# Patient Record
Sex: Female | Born: 1940 | Race: White | Hispanic: No
Health system: Midwestern US, Academic
[De-identification: ages and names within clinical notes are randomized; demographics above are authoritative.]

---

## 2016-06-27 MED ORDER — METOPROLOL TARTRATE 25 MG PO TAB
12.5 mg | ORAL_TABLET | Freq: Two times a day (BID) | ORAL | 3 refills | 90.00000 days | Status: DC
Start: 2016-06-27 — End: 2017-05-30

## 2017-05-30 ENCOUNTER — Ambulatory Visit: Admit: 2017-05-30 | Discharge: 2017-05-30 | Payer: MEDICARE

## 2017-05-30 DIAGNOSIS — R002 Palpitations: Principal | ICD-10-CM

## 2017-05-30 DIAGNOSIS — F4329 Adjustment disorder with other symptoms: ICD-10-CM

## 2017-05-30 DIAGNOSIS — I700 Atherosclerosis of aorta: ICD-10-CM

## 2017-05-30 LAB — LIPID PROFILE
Lab: 128 mg/dL — ABNORMAL HIGH (ref <100)
Lab: 147 mg/dL (ref <150)
Lab: 154 mg/dL
Lab: 222 mg/dL — ABNORMAL HIGH (ref <200)
Lab: 29 mg/dL
Lab: 68 mg/dL (ref >40)

## 2017-06-03 ENCOUNTER — Encounter: Admit: 2017-06-03 | Discharge: 2017-06-03 | Payer: MEDICARE

## 2017-06-03 DIAGNOSIS — M797 Fibromyalgia: ICD-10-CM

## 2017-06-03 DIAGNOSIS — R002 Palpitations: Principal | ICD-10-CM

## 2018-08-06 ENCOUNTER — Encounter: Admit: 2018-08-06 | Discharge: 2018-08-06

## 2018-08-13 ENCOUNTER — Encounter: Admit: 2018-08-13 | Discharge: 2018-08-13

## 2018-08-13 ENCOUNTER — Ambulatory Visit: Admit: 2018-08-13 | Discharge: 2018-08-14

## 2018-08-13 DIAGNOSIS — Z9889 Other specified postprocedural states: Secondary | ICD-10-CM

## 2018-08-13 DIAGNOSIS — R002 Palpitations: Secondary | ICD-10-CM

## 2018-08-13 DIAGNOSIS — I700 Atherosclerosis of aorta: Secondary | ICD-10-CM

## 2018-08-13 DIAGNOSIS — M797 Fibromyalgia: Secondary | ICD-10-CM

## 2018-08-13 NOTE — Patient Instructions
Your palpitations were clearly not cardiac in origin.  Sometimes will want a pursue evaluation for problem that seems to have gone away but in your case it is clear that the esophagus was the cause of the problem and that is now been corrected.    We did not have a hard decision about tell you did not start on a statin like Lipitor for your risk over 10 years is 12.3% without it in the computer projects risk reduction down to 12.0% with 10 years of taking pills.  It is very clear that taking 3000 doses of statin over 10 years to get trivial change in risk is a waste of time.    I do not think you have enough going on to warrant me telling you that you need to schedule appointment and a specific date in the future.  Let us leave the follow-up on an "as needed" basis.    Do not forget to be physically active 3 hours a week to make sure that you do not become deconditioned.    It's good to see you today.   Call in if you have problems or questions.   Lenoard Aden, MD

## 2020-07-07 ENCOUNTER — Encounter: Admit: 2020-07-07 | Discharge: 2020-07-07 | Payer: MEDICARE

## 2020-07-07 ENCOUNTER — Ambulatory Visit: Admit: 2020-07-07 | Discharge: 2020-07-07 | Payer: MEDICARE

## 2020-07-07 DIAGNOSIS — R55 Syncope and collapse: Secondary | ICD-10-CM

## 2020-07-07 DIAGNOSIS — M797 Fibromyalgia: Secondary | ICD-10-CM

## 2020-07-07 DIAGNOSIS — R002 Palpitations: Secondary | ICD-10-CM

## 2020-07-07 DIAGNOSIS — I208 Other forms of angina pectoris: Secondary | ICD-10-CM

## 2020-07-07 DIAGNOSIS — I471 Supraventricular tachycardia: Secondary | ICD-10-CM

## 2020-07-07 NOTE — Patient Instructions
Everything seems fine.  The findings on your heart rhythm monitor are all normal.  You had 1 4 beat run of a slightly faster heart rate than normal.  There is no reason to stay on medications for this.  This had nothing to do with your blackout.  I think your blackout was a combination of perhaps dehydration and some reflexes that led your blood vessels to dilate and perhaps have your heart slowed down too much.  Its been 4 months and you have not had recurrence.  Cannot guarantee this will never happen again but it is unlikely.  Be sure to stay hydrated through the summer and if you feel lightheaded be prepared to sit down promptly and perhaps lower your head to between your knees.  You are doing fairly well now and I do not think you are going to get into this situation again but its possible.    You do not need any further cardiac testing either because of the blackout or for any other reason right now.    Be sure to stay active 3 hours a week with walking.  You might wish you weight what she did when you graduated from high school but your weight right now gives you a body mass index just barely over 25.  This technically is overweight but I think it is a healthy weight for you and is 20 pounds less than in 2017.    No know when you need to get back up to see me again.  I do not think there is any particular reason to see me in a year but you can schedule an appointment with me at any time.  My nurse or I will be in touch when we see the cholesterol profile.  I doubt that your cholesterol will be high enough to warrant treatment with a statin pill.    It's good to see you today.   My Chart is the best way to communicate with [place] but if you prefer, call in if you have problems or questions.   Marissa Nestle, MD

## 2020-07-07 NOTE — Progress Notes
Date of Service: 07/07/2020    Robin Braun is a 80 y.o. female.       HPI     Robin Braun presents for interval cardiology evaluation with the main focus being on a episode of syncope that occurred 03/26/20 while she was spending the winter in [place] she was evaluated by her cardiologist in [place]..  She has those records with her.  Holter monitor after her syncope episode was basically unremarkable although a 4 beat run of SVT at a rate of 142 prompted treatment with Toprol-XL 50 which was then reduced to 25 due to fatigue.  She had no atrial fib or flutter no prolonged pauses.  She continues taking this and reports that even the low-dose Toprol-XL makes her feel fatigued and unable to get in to her normal activities of daily living pattern without feeling too tired.  She is not having any shortness of breath or wheezing she just feels tired.    I last saw her a couple of years ago for general health evaluation as I take care of her late husband Robin Braun for many years before he passed away apparently because of a inoperable AV malformation in his brain.  She is never had any major cardiovascular disease problems.  She has had her left knee replaced without cardiovascular events.  I evaluated her for palpitations in the past in 2007 and again in 2013 with no significant findings.    She is a lifetime non-smoker with less than a full bottle of any alcoholic beverage in her life total.  Her cholesterol when last checked was minimally out of line with LDL 128 that did not warrant statin treatment.  She is fasting today so we cannot recheck.  She is not discussing cholesterol follow-up with Dr. Herschell Dimes her primary care physician when she is in [place].   05/30/2017 11:18   Cholesterol 222 (H)   Triglycerides 147   HDL 68   LDL 128 (H)     She will haver her right knee replace by Dr. Jearl Klinefelter sometime late summer at [facility].        Vitals:    07/07/20 1108   BP: 112/62   BP Source: Arm, Left Upper Pulse: 55   PainSc: Zero   Weight: 69 kg (152 lb 1.6 oz)   Height: 162.6 cm (5' 4)     Body mass index is 26.11 kg/m?[name]     Past Medical History  Patient Active Problem List    Diagnosis Date Noted   ?  esophageal hernia repairs, 2012, 2020 08/13/2018     2012 Initial Mesh repair [name], [name]  July 1,202 Laparoscopic Revision of repair [name], [name]     ? 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 She has a IgG level of 158, overseen by [name],      ? Bronchiectasis without complication (HCC) 02/10/2014     bronchiectasis  Noted by [name].      ? Hepatic steatosis 02/10/2014     2015: CT abdomen: mild fatty infiltration of the liver, mild intrahepatic ductal dilatation.  cholecystectomy. There was mild extrahepatic ductal dilatation, common bile duct was 5.6 mm and on prior CT of 4.4 mm. Suggestion of some mild wall thickening of the  distal common duct. There were no hepatic filling defects seen and no adenopathy was seen.Impressions:   fatty infiltration of the liver, post cholecystectomy with ectasia of the bile duct, and mild thickening of the wall of the common duct. In the presence of mildly elevated liver enzymes without elevated bilirubin a biopsy should be considered. No focal solid organ abnormality, a small hiatal hernia, mild left colon diverticulosis, post hysterectomy changes of the bladder.       ? Palpitation 12/14/2011     2007 palpitations, resolved spontanously  11/13 recurrent palpitations, Eval [facility] [name]:K+=4.7, EchoDop: EF 60%, normal LA, No TR, PAP not measurable, Valves OK no effusion.  32 day  event monitor: 5episodes of pounding heartbeats and fluttering palpitations associated w/ RBBB single  PVCs.  06/27/13 8 1/2 minute Bruce Exercise Echo EF 55%, no ischemia. Valves OK.  HR>100% target, rare PVC, stopped w/ fatigue, no ST changes or arrhythmias.            Review of Systems   Constitutional: Positive for malaise/fatigue.   HENT: Negative.    Eyes: Negative.    Cardiovascular: Negative.    Respiratory: Negative.    Endocrine: Negative.    Hematologic/Lymphatic: Negative.    Skin: Negative.    Musculoskeletal: Negative.    Gastrointestinal: Negative.    Genitourinary: Negative.    Neurological: Negative.    Psychiatric/Behavioral: Negative.    Allergic/Immunologic: Negative.    All other systems reviewed and are negative.  14 organ system review noted. It is negative except as reported in current narrative or above in the ROS section. This is a patient centered review of systems that was stated by the patient in her terms prior to my personal problem oriented interview with the patient     Physical Exam  General: Patient in no distress, looks generally healthy. Skin warm and dry, non icteric,  Wearing medical face mask   Mucous membranes moist.  Pupils equal and round  Carotids: no bruits    Thyroid not enlarged.  Neck veins: CVP <6 normal, no V wave, no HJR     Respiratory: Breathing comfortably. Lungs clear to percussion & auscultation. No rales, rhonchi or wheezing   Cardiac: Regular rhythm. LV impulse not palpable. Normal S1 & S2, Fourth heart sound, no rub or S3.  Gr I/vi left parasternal systolic murmur very localized. No diastolic murmur   Abdomen: soft, non-tender, no masses,bruits,hepatic or aortic enlargement. + bowel sounds.   Femoral arteries: Good pulses, no bruits.  Legs/feet: Normal PT pulses, no edema.   Motor: Normal muscle strength. Cognitive: Pleasant demeanor. Good insight. 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   08/13/18 27.62 kg/m?   05/30/17 27.81 kg/m?      Smoking Social History     Tobacco Use   Smoking Status Never Smoker   Smokeless Tobacco Never Used      Lipid Profile Cholesterol   Date Value Ref Range Status  05/30/2017 222 (H) <200 MG/DL Final     HDL   Date Value Ref Range Status   05/30/2017 68 >40 MG/DL Final     LDL   Date Value Ref Range Status   05/30/2017 128 (H) <100 MG/DL Final     Triglycerides   Date Value Ref Range Status   05/30/2017 147 <150 MG/DL Final      Blood Sugar No results found for: HGBA1C  Glucose   Date Value Ref Range Status   01/09/2018 144 (H) 83 - 110 Final   01/03/2016 105  Final   02/10/2014 99  Final          Problems Addressed Today  No diagnosis found.    Assessment and Plan     Just blood pressure readings are all all fine with the vast majority below 130/80.  Her exam is benign.  She has minimal systolic murmur that is unchanged from when I saw her previously and noted it to be marginally worth reporting.  Her echo done in February after her syncopal episode in [place] is totally benign with no evidence of even aortic sclerosis worth mentioning.  Her Holter monitor was basically normal.  The 4 beat run of SVT is clinically insignificant.  She was started on 50 mg Toprol daily for that and cut back to 25 when she felt fatigue.  She did not does not feel as good taking 25 that she did drug-free.  There is no indication for her to stay on the Toprol so I am telling her she can stop the 25 mg dose completely without further taper effective today.    That it would be a good for her to check her lipid profile.  She can do this in [place].  We will put the order in.  The results can be routed back to me or through [facility].  I will give her an opinion about need for statin but I do not think it that she will have a strong indication.    She will have her right knee replaced by Dr[facility]Wilkerson at [facility] in a few months once the scans and scheduling has been completed.  There is no reason for her to have any further cardiac testing or to see me or again for pro preop clearance.  She has perfectly fine cardiac system and is is is is good the condition is in a 80 year old woman could be before knee replacement.    I am not sure that she needs annual follow-up with me but she made her way from [place] to the [place] office today to see me and can do so again when the need arises.    30 minutes were expended as the total time for this encounter.  The time was spent reviewing records,  interviewing patient, doing exam, developing diagnosis, creating treatment plan written in patient oriented  terminology  for the AVS, explaining it to the patient and entering further information in the EMR.      NB: The free text in this document was generated through Dragon(TM) software with editing and proofreading  done by the author of this document Dr. Mable Paris MD, [facility] principally at the point of care. Some errors may persist.  If there are questions about content in this document please contact Dr. Hale Bogus.    The written information I provided Ms. Licklider at the conclusion of today's encounter is as  follows:    Patient Instructions   Everything  seems fine.  The findings on your heart rhythm monitor are all normal.  You had 1 4 beat run of a slightly faster heart rate than normal.  There is no reason to stay on medications for this.  This had nothing to do with your blackout.  I think your blackout was a combination of perhaps dehydration and some reflexes that led your blood vessels to dilate and perhaps have your heart slowed down too much.  Its been 4 months and you have not had recurrence.  Cannot guarantee this will never happen again but it is unlikely.  Be sure to stay hydrated through the summer and if you feel lightheaded be prepared to sit down promptly and perhaps lower your head to between your knees.  You are doing fairly well now and I do not think you are going to get into this situation again but its possible.    You do not need any further cardiac testing either because of the blackout or for any other reason right now.    Be sure to stay active 3 hours a week with walking.  You might wish you weight what she did when you graduated from high school but your weight right now gives you a body mass index just barely over 25.  This technically is overweight but I think it is a healthy weight for you and is 20 pounds less than in 2017.    No know when you need to get back up to see me again.  I do not think there is any particular reason to see me in a year but you can schedule an appointment with me at any time.  My nurse or I will be in touch when we see the cholesterol profile.  I doubt that your cholesterol will be high enough to warrant treatment with a statin pill.    It's good to see you today.   My Chart is the best way to communicate with [place] but if you prefer, call in if you have problems or questions.   Marissa Nestle, MD              Current Medications (including today's revisions)  ? metoprolol XL (TOPROL XL) 25 mg extended release tablet Take 25 mg by mouth daily.   ? progesterone, micronized (PROMETRIUM) 200 mg capsule Take 200 mg by mouth daily.

## 2020-08-23 ENCOUNTER — Encounter: Admit: 2020-08-23 | Discharge: 2020-08-23 | Payer: MEDICARE

## 2020-08-23 ENCOUNTER — Ambulatory Visit: Admit: 2020-08-23 | Discharge: 2020-08-23 | Payer: MEDICARE

## 2020-08-23 DIAGNOSIS — I493 Ventricular premature depolarization: Secondary | ICD-10-CM

## 2020-08-23 DIAGNOSIS — R002 Palpitations: Secondary | ICD-10-CM

## 2020-08-23 DIAGNOSIS — M797 Fibromyalgia: Secondary | ICD-10-CM

## 2020-08-23 MED ORDER — METOPROLOL SUCCINATE 25 MG PO TB24
12.5 mg | ORAL_TABLET | Freq: Every evening | ORAL | 1 refills | 90.00000 days | Status: AC
Start: 2020-08-23 — End: ?

## 2020-08-23 NOTE — Patient Instructions
Start taking Metoprolol 12.5 mg (1/2 tablet) at bedtime.     Increase water intake and limit caffeine and tea drinks.     We will call you in 2 weeks to see how you are feeling.

## 2020-08-23 NOTE — Progress Notes
Date of Service: 08/23/2020    Robin Braun is a 80 y.o. female.       HPI     Robin Braun presents to the office today for cardiovascular follow up.  She is a 80 y.o. female with a past medical history of palpitations and aortic valve sclerosis.  She follows with Dr. Hale Bogus and was last seen on July 07, 2020.  While she was vacationing in [place] last winter she had an episode of syncope in her cardiologist placed a Holter monitor that revealed brief runs of SVT and she was initiated on metoprolol XL 50 mg daily that was reduced to 25 mg daily due to fatigue.  At her last office visit in June this was stopped due to similar complaints.    She recently called her office with increasing symptomatic palpitations and presents today with her husband for evaluation.  She states since her metoprolol was discontinued last month, she has had recurrence of her palpitations with associated complaints of chest tightness and lightheadedness.  She denies chest pain, shortness of breath, peripheral edema, near-syncope or syncope.  She states her worst episode was while they were traveling in their RV and she had to rest for 2 hours before she could return to any physical activity.        Vitals:    08/23/20 1251   BP: 112/62   BP Source: Arm, Left Upper   Pulse: 93   SpO2: 96%   PainSc: Zero   Weight: 67.9 kg (149 lb 12.8 oz)   Height: 165.1 cm (5' 5)     Body mass index is 24.93 kg/m?Robin Braun     Past Medical History  Patient Active Problem List    Diagnosis Date Noted   ? 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  esophageal hernia repairs, 2012, 2020 08/13/2018     2012 Initial Mesh repair Robin Braun, Robin Braun  July 1,202 Laparoscopic Revision of repair Robin Braun, Dr Maricela [facility]     ? 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  EF 60 to 65% no valve disease.  Septum 1.67 cm posterior wall 1.44 cm     ? 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 She has a IgG level of 158, overseen by Robin Braun,      ? Bronchiectasis without complication (HCC) 02/10/2014     bronchiectasis  Noted by Robin Braun.      ? Hepatic steatosis 02/10/2014     2015: CT abdomen: mild fatty infiltration of the liver, mild intrahepatic ductal dilatation.  cholecystectomy. There was mild extrahepatic ductal dilatation, common bile duct was 5.6 mm and on prior CT of 4.4 mm. Suggestion of some mild wall thickening of the distal common duct. There were no hepatic filling defects seen and no adenopathy was seen.Impressions:   fatty infiltration of the liver, post cholecystectomy with ectasia of the bile duct, and mild thickening of the wall of the common duct. In the presence of mildly elevated liver enzymes without elevated bilirubin a biopsy should be considered. No focal solid organ abnormality, a small hiatal hernia, mild left colon diverticulosis, post hysterectomy changes of the bladder.       ? Palpitation 12/14/2011     2007 palpitations, resolved spontanously  11/13 recurrent  palpitations, Eval [facility] [name]:K+=4.7, EchoDop: EF 60%, normal LA, No TR, PAP not measurable, Valves OK no effusion.  32 day  event monitor: 5episodes of pounding heartbeats and fluttering palpitations associated w/ RBBB single  PVCs.  06/27/13 8 1/2 minute Bruce Exercise Echo EF 55%, no ischemia. Valves OK.  HR>100% target, rare PVC, stopped w/ fatigue, no ST changes or arrhythmias.            Review of Systems   Constitutional: Positive for malaise/fatigue.   HENT: Negative.    Eyes: Negative.    Cardiovascular: Positive for irregular heartbeat and palpitations.   Respiratory: Negative.    Endocrine: Negative.    Hematologic/Lymphatic: Negative.    Skin: Negative.    Musculoskeletal: Negative.    Gastrointestinal: Negative.    Genitourinary: Negative.    Neurological: Negative. Psychiatric/Behavioral: Negative.    Allergic/Immunologic: Negative.    All other systems reviewed and are negative.      Physical Exam  General Appearance: no acute distress  Skin: warm & intact  HEENT: unremarkable  Neck Veins: neck veins are flat & not distended  Carotid Arteries: no bruits  Chest Inspection: chest is normal in appearance  Auscultation/Percussion: lungs clear to auscultation, no rales, rhonchi, or wheezing  Cardiac Rhythm: regular rhythm & normal rate  Cardiac Auscultation: Normal S1 & S2, no S3 or S4, no rub  Murmurs: no cardiac murmurs   Extremities: no lower extremity edema; 2+ symmetric distal pulses  Abdominal Exam: soft, non-tender, no masses, bowel sounds normal  Neurologic Exam: oriented to time, place and person; no focal neurologic deficits  Psychiatric: Normal mood and affect.  Behavior is normal. Judgment and thought content normal.         Cardiovascular Studies      Cardiovascular Health Factors  Vitals BP Readings from Last 3 Encounters:   08/23/20 112/62   07/07/20 112/62   08/13/18 112/64     Wt Readings from Last 3 Encounters:   08/23/20 67.9 kg (149 lb 12.8 oz)   07/07/20 69 kg (152 lb 1.6 oz)   08/13/18 75.3 kg (166 lb)     BMI Readings from Last 3 Encounters:   08/23/20 24.93 kg/m?   07/07/20 26.11 kg/m?   08/13/18 27.62 kg/m?      Smoking Social History     Tobacco Use   Smoking Status Never Smoker   Smokeless Tobacco Never Used      Lipid Profile Cholesterol   Date Value Ref Range Status   05/30/2017 222 (H) <200 MG/DL Final     HDL   Date Value Ref Range Status   05/30/2017 68 >40 MG/DL Final     LDL   Date Value Ref Range Status   05/30/2017 128 (H) <100 MG/DL Final     Triglycerides   Date Value Ref Range Status   05/30/2017 147 <150 MG/DL Final      Blood Sugar No results found for: HGBA1C  Glucose   Date Value Ref Range Status   01/09/2018 144 (H) 83 - 110 Final   01/03/2016 105  Final   02/10/2014 99  Final          Problems Addressed Today  Encounter Diagnoses Name Primary?   ? Palpitation Yes   ? Symptomatic PVCs        Assessment and Plan     1.  Palpitations with symptomatic PVCs.  She wore a Holter monitor earlier this year at the direction of her cardiologist in  [place] that did reveal brief runs of SVT and PVCs.  She was initiated on metoprolol XL 50 mg daily that was then reduced 25 mg daily due to fatigue.  This was ultimately stopped on June 8 by Dr. Hale Bogus.  Since that time she has had an increase in her palpitations and she has been quite symptomatic recently.  We discussed resuming metoprolol XL 12.5 mg and to take at bedtime.  She will continue to track her blood pressure and heart rate log.  We will call her in 2 weeks to see if her symptoms have improved.    Judieth Keens, APRN-NP           Current Medications (including today's revisions)  ? cholecalciferol (vitamin D3) (VITAMIN D3) 1,000 units tablet Take 1,000 Units by mouth daily.   ? metoprolol XL (TOPROL XL) 25 mg extended release tablet Take one-half tablet by mouth at bedtime daily.   ? progesterone, micronized (PROMETRIUM) 200 mg capsule Take 200 mg by mouth daily.   ? zinc sulfate 220 mg (50 mg elemental zinc) capsule Take 220 mg by mouth daily.

## 2020-10-18 ENCOUNTER — Encounter: Admit: 2020-10-18 | Discharge: 2020-10-18 | Payer: MEDICARE

## 2020-10-18 ENCOUNTER — Ambulatory Visit: Admit: 2020-10-18 | Discharge: 2020-10-18 | Payer: MEDICARE

## 2020-10-18 DIAGNOSIS — Z9889 Other specified postprocedural states: Secondary | ICD-10-CM

## 2020-10-18 DIAGNOSIS — I209 Angina pectoris, unspecified: Secondary | ICD-10-CM

## 2020-10-18 DIAGNOSIS — Z136 Encounter for screening for cardiovascular disorders: Secondary | ICD-10-CM

## 2020-10-18 DIAGNOSIS — R002 Palpitations: Secondary | ICD-10-CM

## 2020-10-18 DIAGNOSIS — M797 Fibromyalgia: Secondary | ICD-10-CM

## 2020-10-18 LAB — COMPREHENSIVE METABOLIC PANEL
ALBUMIN: 4 g/dL (ref 3.5–5.0)
ALK PHOSPHATASE: 105 U/L (ref 25–110)
ALT: 15 U/L (ref 7–56)
ANION GAP: 9 (ref 3–12)
AST: 22 U/L (ref 7–40)
BLD UREA NITROGEN: 12 mg/dL (ref 7–25)
CALCIUM: 9.5 mg/dL (ref 8.5–10.6)
CHLORIDE: 104 MMOL/L (ref 98–110)
CO2: 28 MMOL/L (ref 21–30)
CREATININE: 0.7 mg/dL (ref 0.4–1.00)
EGFR: >60 mL/min (ref >60)
GLUCOSE,PANEL: 99 mg/dL (ref 70–100)
POTASSIUM: 4.6 MMOL/L (ref 3.5–5.1)
SODIUM: 141 MMOL/L (ref 137–147)
TOTAL BILIRUBIN: 0.3 mg/dL (ref 0.3–1.2)
TOTAL PROTEIN: 6.4 g/dL (ref 6.0–8.0)

## 2020-10-18 LAB — CBC AND DIFF
ABSOLUTE BASO COUNT: 0 K/UL (ref 0–0.20)
ABSOLUTE EOS COUNT: 0.4 K/UL (ref 0–0.45)
ABSOLUTE LYMPH COUNT: 1.2 K/UL (ref 1.0–4.8)
ABSOLUTE MONO COUNT: 0.7 K/UL (ref 0–0.80)
ABSOLUTE NEUTROPHIL: 5.5 K/UL (ref 1.8–7.0)
BASOPHILS %: 1 % (ref 0–2)
EOSINOPHILS %: 6 % — ABNORMAL HIGH (ref 0–5)
HEMATOCRIT: 34 % — ABNORMAL LOW (ref 36–45)
HEMOGLOBIN: 11 g/dL — ABNORMAL LOW (ref 12.0–15.0)
LYMPHOCYTES %: 15 % — ABNORMAL LOW (ref 24–44)
MCH: 29 pg (ref 26–34)
MCHC: 33 g/dL (ref 32.0–36.0)
MCV: 87 FL (ref 80–100)
MONOCYTES %: 9 % (ref 4–12)
MPV: 9.4 FL (ref 7–11)
NEUTROPHILS %: 69 % (ref 41–77)
PLATELET COUNT: 261 K/UL (ref 150–400)
RBC COUNT: 3.9 M/UL — ABNORMAL LOW (ref 4.0–5.0)
RDW: 13 % (ref 11–15)
WBC COUNT: 8 K/UL (ref 4.5–11.0)

## 2020-10-18 LAB — BNP (B-TYPE NATRIURETIC PEPTI): BNP: 54 pg/mL (ref 0–100)

## 2020-10-18 MED ORDER — NITROGLYCERIN 0.3 MG SL SUBL
0.3 mg | ORAL_TABLET | SUBLINGUAL | 1 refills | 9.00000 days | Status: AC | PRN
Start: 2020-10-18 — End: ?

## 2020-10-18 NOTE — Progress Notes
Date of Service: 10/18/2020    Robin Braun is a 80 y.o. female.       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  She is a 80 year old generally active resident of [place] who new for years as a wife of another patient of mine who has since died from a ruptured brain aneurysm.  Today she brought in her blood pressure log that shows generally satisfactory blood pressures but 1 reading in the 160 range that caught my attention.  She states this happens when she has trouble with GI distress after eating.  She gets palpitations her esophagus spasm really bothers her and her blood pressure readings are high.  She has had 2 esophageal hernia repairs the first in 2012 and the second in 2020.  Her pain and symptoms improved with the second surgery but she still has them episodically.  When she is at the worst part of the pain she will feel her heart pounding rapidly.    She had a couple of episodes 1 in church yesterday when she stood up she felt a wave of rapid heartbeats and felt a little unsteady but it passed.  She had a similar episode on Friday.    Although this was not her primary concern today as I asked her to give a more complete review of her current functional state she told me that she has recently had episodes with physical activity such as walking up the stairs in her basement when she gets severe retrosternal chest pain that she describes by saying it just hurts so bad.  She rests a while and it resolves.  These do not occur with every physical activity that she does but she does relate them more often than at other times to activities such as walking up the stairs from her basement.  Additionally she had 3 of these episodes awaken a week ago.  She states that this is  the same type of discomfort that she describes when she was walking up the stairs    Additionally she   has episodic feelings where is like she has a rope wrapped around her chest and she just cannot breathe.  She talked to [name] about this who sees her for bronchiectasis.  He told her this is not a pulmonary problem.    Other than postprandial pain which is different from what she is describing above related to symptoms she had hoped would resolve after her second esophageal hernia repair she has no major issues.  Vitals:    10/18/20 1534   BP: 118/64   BP Source: Arm, Left Upper   Pulse: 58   SpO2: 94%   PainSc: Zero   Weight: 68.3 kg (150 lb 9.6 oz)   Height: 165.1 cm (5' 5)     Body mass index is 25.06 kg/m?[name]     Past Medical History  Patient Active Problem List    Diagnosis Date Noted   ? 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 Esophageal hernia repairs, 2012, 2020 08/13/2018     2012 Initial Mesh repair [name], [name]  July 1,2020 Laparoscopic Revision of repair [name], Dr [name] surgeons. [facility]     ? 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  EF 60 to 65% no valve disease.  Septum 1.67 cm posterior wall 1.44 cm     ? 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 She has a IgG level of 158, overseen by [name],      ? Bronchiectasis without complication (HCC) 02/10/2014     bronchiectasis  Noted by [name].      ? Hepatic steatosis 02/10/2014     2015: CT abdomen: mild fatty infiltration of the liver, mild intrahepatic ductal dilatation.  cholecystectomy. There was mild extrahepatic ductal dilatation, common bile duct was 5.6 mm and on prior CT of 4.4 mm. Suggestion of some mild wall thickening of the distal common duct. There were no hepatic filling defects seen and no adenopathy was seen.Impressions:   fatty infiltration of the liver, post cholecystectomy with ectasia of the bile duct, and mild thickening of the wall of the common duct. In the presence of mildly elevated liver enzymes without elevated bilirubin a biopsy should be considered. No focal solid organ abnormality, a small hiatal hernia, mild left colon diverticulosis, post hysterectomy changes of the bladder.       ? Palpitation 12/14/2011     2007 palpitations, resolved spontanously  11/13 recurrent palpitations, Eval [facility] [name]:K+=4.7, EchoDop: EF 60%, normal LA, No TR, PAP not measurable, Valves OK no effusion.  32 day  event monitor: 5episodes of pounding heartbeats and fluttering palpitations associated w/ RBBB single  PVCs.  06/27/13 8 1/2 minute Bruce Exercise Echo EF 55%, no ischemia. Valves OK.  HR>100% target, rare PVC, stopped w/ fatigue, no ST changes or arrhythmias.            Review of Systems   Constitutional: Positive for malaise/fatigue.   HENT: Negative.    Eyes: Negative.    Cardiovascular: Negative.    Respiratory: Negative.    Endocrine: Negative.    Hematologic/Lymphatic: Negative.    Skin: Negative.    Musculoskeletal: Negative.    Gastrointestinal: Negative.    Genitourinary: Negative.    Neurological: Negative.    Psychiatric/Behavioral: Negative.    Allergic/Immunologic: Negative.    14 organ system review noted. It is negative except as reported in current narrative or above in the ROS section. This is a patient centered review of systems that was stated by the patient in her terms prior to my personal problem oriented interview with the patient     Physical Exam  General: Patient in no distress, looks generally healthy. Skin warm and dry, non icteric,  Wearing medical face mask   Mucous membranes moist.  Pupils equal and round  Carotids: no bruits    Thyroid not enlarged.  Neck veins: CVP <6 normal, no V wave, no HJR     Respiratory: Breathing comfortably. Lungs clear to percussion & auscultation. No rales, rhonchi or wheezing   Cardiac: Regular rhythm. LV impulse not palpable. Normal S1 & S2, Fourth heart sound, no rub or S3.  Gr I/vi left parasternal systolic murmur. No diastolic murmur   Abdomen: soft, non-tender, no masses,bruits,hepatic or aortic enlargement. + bowel sounds.   Femoral arteries: Good pulses, no bruits.  Legs/feet: Normal PT pulses, no edema.   Motor: Normal muscle strength. Cognitive: Pleasant demeanor. Good insight. No depression     Cardiovascular Studies  Today's 12 lead EKG: sinus bradycardia, rate 58. 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   08/23/20 24.93 kg/m?   07/07/20 26.11 kg/m?      Smoking Social History     Tobacco Use   Smoking Status Never Smoker   Smokeless Tobacco Never Used      Lipid Profile Cholesterol   Date Value Ref Range Status   05/30/2017 222 (H) <200 MG/DL Final     HDL   Date Value Ref Range Status   05/30/2017 68 >40 MG/DL Final     LDL   Date Value Ref Range Status   05/30/2017 128 (H) <100 MG/DL Final     Triglycerides   Date Value Ref Range Status   05/30/2017 147 <150 MG/DL Final      Blood Sugar No results found for: HGBA1C  Glucose   Date Value Ref Range Status   01/09/2018 144 (H) 83 - 110 Final   01/03/2016 105  Final   02/10/2014 99  Final          Problems Addressed Today  Encounter Diagnoses   Name Primary?   ? Screening for heart disease Yes   ?  esophageal hernia repairs, 2012, 2020        Assessment and Plan     Mrs. Hankey is having palpitations and chest pain with exertion such as walking upstairs.  She has not had a stress test since 2011.  She has had 3 symptoms 3 episodes of breakthrough at night that awaken her from sleep with the same chest pain.  In this setting with recent episodes of symptoms awaking her from sleep it is not safe to recommend a stress test for her as stress tests are contraindicated with rest symptoms.  The characteristics of her pain or not that typical of angina but in a 80 year old woman she is at risk for having significant ischemic heart disease and be suffering atypical chest symptoms.    There are aspects of her symptoms that are not classic for coronary artery disease but at her age she is at a high enough risk that her chest pain due to myocardial ischemia from fixed coronary disease that we need to proceed with the angiogram .With rest angina a possible diagnosis we cannot proceed with stress testing but must do angiography.  I talked her about doing either CT angio which would leave [place] with no option for treatment if we do find a high-grade lesion.  I also talked to her about doing coronary artery catheterization with angiography which would offer [place] the ability to place a stent of a high-grade lesion were seen.    She lives in [place].  She agrees with my thought that its more appropriate to have her come down to [facility] once for the cath and if there is a coronary lesion we could get the stent placed at that time.    She is also having some palpitations.  If she has fixed coronary disease that is stented  we can wait to see if she has residual palpitations once the  ischemia is relieved.  If the coronary arteries do not show obstruction then we will place a event monitor.  She could have the event monitor placed in [place] or at [place] depending on where she is when we make the decision to place to monitor    I did not set up a specific follow-up time for her but I anticipate I will see her in follow-up within a month or 2 after the angiogram and if it is needed the rhythm monitor.    I do not have a recent lipid profile on her.  She is not fasting today.  She can have a fasting lipid profile she comes in for the cath.    40 minutes were expended as the total time for this encounter.  The time was spent reviewing records,  interviewing patient, doing exam, developing diagnosis, creating treatment plan written in patient oriented  terminology  for the AVS, explaining it to the patient and entering further information in the EMR.       Current Medications (including today's revisions)  ? cholecalciferol (vitamin D3) (VITAMIN D3) 1,000 units tablet Take 1,000 Units by mouth daily.   ? metoprolol XL (TOPROL XL) 25 mg extended release tablet Take one-half tablet by mouth at bedtime daily.   ? progesterone, micronized (PROMETRIUM) 200 mg capsule Take 200 mg by mouth daily.   ? zinc sulfate 220 mg (50 mg elemental zinc) capsule Take 220 mg by mouth daily.

## 2020-10-19 ENCOUNTER — Encounter: Admit: 2020-10-19 | Discharge: 2020-10-19 | Payer: MEDICARE

## 2020-10-19 DIAGNOSIS — I493 Ventricular premature depolarization: Secondary | ICD-10-CM

## 2020-10-19 DIAGNOSIS — I209 Angina pectoris, unspecified: Secondary | ICD-10-CM

## 2020-10-19 DIAGNOSIS — I200 Unstable angina: Secondary | ICD-10-CM

## 2020-10-19 MED ORDER — ASPIRIN 325 MG PO TAB
325 mg | Freq: Once | ORAL | 0 refills
Start: 2020-10-19 — End: ?

## 2020-10-19 NOTE — Progress Notes
Website with [facility], confirmed benefits and eligibility:  Current and active since 05/30/20, max OOP $1000, then plan will pay 100% of allowable charges.      Per [employer] prior Berkley Harvey is required for LV Cors 26333 and poss PCI [phone].  Prior auth initiated for LV Cors 56389 through [facility] Help website, clinicals have been submitted.  Case is PENDING at this time.   REF #  [id number]    If PCI [phone] is completed retroauthorization will be required.

## 2020-10-29 ENCOUNTER — Encounter: Admit: 2020-10-29 | Discharge: 2020-10-29 | Payer: MEDICARE

## 2020-10-29 NOTE — Patient Instructions
CARDIAC CATHETERIZATION   PRE-ADMISSION INSTRUCTIONS    Patient Name: Robin Braun  MRN#: [id number]  Date of Birth: Jun 26, 1940 (80 y.o.)  Today's Date: 10/29/2020    PROCEDURE:  You are scheduled for a Coronary Angiogram with possible Angioplasty/Stenting with Dr. Jonell Cluck.    PROCEDURE DATE AND ARRIVAL TIME:  Your procedure date is 11/10/2020.  You will receive a call from the Cath lab staff between 8:00 a.m. and noon on the business day prior to your procedure to let you know at what time to arrive on the day of your procedure.    Please check in at the Admitting Desk in the [facility] for your procedure. [facility] Entrance and and take a right. Continue down the hallway past the Cardiovascular Medicine office. That hall will take you into the [facility]. Check in at the desk on the left side.)     (If you have further questions regarding your arrival time for the CV lab, please call [phone] by 3:00pm the day before your procedure. Please leave a message with your name and number, your call will be returned in a timely manner.)    PRE-PROCEDURE APPOINTMENTS:    10/18/2020 at [place]   Office visit to update history and physical (requirement within 30 days of procedure)  with Dr Hale Bogus at Cardiovascular Medicine         10/18/2020   Pre-Admission lab work required within 14 days of procedure: BMP at the [facility] [place] clinic.            FOOD AND DRINK INSTRUCTIONS  Nothing to eat after midnight before your procedure. No caffeine for 24 hours prior to your procedure. You will be under moderate sedation for your procedure.  You may drink clear liquids up to an hour before hospital arrival. This will be confirmed by the Cath lab staff the day before your procedure.     SPECIAL MEDICATION INSTRUCTIONS  Any new prescriptions will be sent to your pharmacy listed on file with [place].       Please either take 4 baby aspirins (4 times 81mg ) or one full strength NON-COATED 325mg  aspirin. TAKE AM OF PROCEDURE:  Please take your Metoprolol XL the morning of your procedure      HOLD ALL over the counter vitamins or supplements on the morning of your procedure.      Additional Instructions  If you wear CPAP, please bring your mask and machine with you to the hospital.    Take a bath or shower with anti-bacterial soap the evening before, or the morning of the procedure.     Bring photo ID and your health insurance card(s).    Arrange for a driver to take you home from the hospital. Please arrange for a friend or family member to take you home from this test. You cannot take a Taxi, Benedetto Goad, or public transportation as there has to be a responsible person to help care for you after sedation    Bring an accurate list of your current medications with you to the hospital (all medications and supplements taken daily).  Please use the medication list below and write in the date and time when you took your last dose before your procedure. Update this list of medications as needed.      Wear comfortable clothes and don't bring valuables, other than photo identification card, with you to the hospital.    Please pack a bag for an overnight stay.  Please review your pre-procedure instructions and bring them with you on the day of your procedure.  Call the office at [phone] with any questions. You may ask to speak with Dr. Sherrell Puller nurse.        ALLERGIES  Allergies   Allergen Reactions    Celebrex [Celecoxib] ANXIETY    Codeine UNKNOWN    Lyrizine ANXIETY    Morphine UNKNOWN    Vibramycin [Doxycycline Calcium] ANXIETY       CURRENT MEDICATIONS  Outpatient Encounter Medications as of 10/29/2020   Medication Sig Dispense Refill    cholecalciferol (vitamin D3) (VITAMIN D3) 1,000 units tablet Take 1,000 Units by mouth daily.      metoprolol XL (TOPROL XL) 25 mg extended release tablet Take one-half tablet by mouth at bedtime daily. 90 tablet 1    nitroglycerin (NITROSTAT) 0.3 mg tablet Place one tablet under tongue every 5 minutes as needed for Chest Pain. 15 tablet 1    progesterone, micronized (PROMETRIUM) 200 mg capsule Take 200 mg by mouth daily.      zinc sulfate 220 mg (50 mg elemental zinc) capsule Take 220 mg by mouth daily.       No facility-administered encounter medications on file as of 10/29/2020.       _________________________________________  Form completed by: Vassie Moment, RN  Date completed: 10/29/20  Method: Via telephone and mailed to the patient.

## 2020-11-10 ENCOUNTER — Encounter: Admit: 2020-11-10 | Discharge: 2020-11-10 | Payer: MEDICARE

## 2020-11-10 DIAGNOSIS — R002 Palpitations: Secondary | ICD-10-CM

## 2020-11-10 DIAGNOSIS — M797 Fibromyalgia: Secondary | ICD-10-CM

## 2020-11-10 MED ADMIN — SODIUM CHLORIDE 0.9 % IV SOLP [27838]: 1000.000 mL | INTRAVENOUS | @ 16:00:00 | Stop: 2020-11-10 | NDC 00338004904

## 2020-11-10 MED ADMIN — SODIUM CHLORIDE 0.9 % IV SOLP [27838]: 1000.000 mL | INTRAVENOUS | @ 16:00:00 | Stop: 2020-11-11 | NDC 00338004904

## 2020-11-10 NOTE — Research Notes
Research Informed Consent Note       Name: Robin Braun    MRN: [id number]              Protocol Name: G-IC-[id number]  Sponsor: [employer]  PI: Greig Castilla, MD  Provider performing procedure: Greig Castilla, MD  Coordinator Consenting: Virl Axe  IRB/HSC Number: [id number]  Consent Approval Dates: 05.26.2022  ICF Signature Date: 10.12.2022      Informed consent was obtained subsequent to receipt of the letter of approval from the [facility]. This information was provided to the subject during this visit.     Subject was alert and oriented during consent discussion. Informed consent was obtained prior to performance of any procedures as outlined in the study protocol. A thorough screening of the participant's chart was completed to identify inclusion criteria per the study protocol and the participant met the criteria to the best of my knowledge, with any clarification discussed with the investigator. No exclusions to the study protocol were found and the participant met all the study enrollment requirements to the best of my knowledge, with any clarification discussed with the investigator.      Study purpose, procedures, benefits, risks and duration of the study, confidentiality information, and compensation were discussed. Subject was again informed of the research nature of the trial and that study participation is voluntary and they may withdraw consent at any time for any reason by notifying study team. The participant voiced understanding that participation is voluntary and that they could withdraw at any time.     Time was given for the participant to read the consent form and ask questions. The participant's questions were answered to their satisfaction by both the research coordinator and the investigator overseeing their care, the participant voiced desire to participate in the study, then signed the consent form approving this. The participant verbalized full understanding of all the details of the consent.     A copy of the consent form was e-mailed to [facility] Information Management (HIM) for scanning into the subject's medical record and a copy was given to the participant for future reference and contacts regarding participation. The consent form discussed above was approved by the [facility] Human Subjects Committee.

## 2020-11-15 ENCOUNTER — Encounter: Admit: 2020-11-15 | Discharge: 2020-11-15 | Payer: MEDICARE

## 2020-11-15 NOTE — Telephone Encounter
Spoke with Robin Braun who was wanting to know who [facility]BP would recommend for GI. States she is going to [place] at the end of Dec and is hoping she can get in with GI before she goes. She is aware CBP is [facility], informed her that [name] is too. Will send [name] a message to address next week when she is back.

## 2020-11-17 ENCOUNTER — Encounter: Admit: 2020-11-17 | Discharge: 2020-11-17 | Payer: MEDICARE

## 2020-11-17 DIAGNOSIS — K224 Dyskinesia of esophagus: Secondary | ICD-10-CM

## 2020-11-17 DIAGNOSIS — Z9889 Other specified postprocedural states: Secondary | ICD-10-CM

## 2020-11-18 ENCOUNTER — Encounter: Admit: 2020-11-18 | Discharge: 2020-11-18 | Payer: MEDICARE

## 2020-11-29 ENCOUNTER — Encounter: Admit: 2020-11-29 | Discharge: 2020-11-29 | Payer: MEDICARE

## 2020-11-29 NOTE — Telephone Encounter
-----   Message from Mable Paris, MD sent at 11/29/2020  3:34 PM CDT -----  Regarding: RE: GI referral  Try [facility] Orleans  ----- Message -----  From: Weston Brass  Sent: 11/29/2020   7:26 AM CDT  To: Mable Paris, MD, Vassie Moment, RN  Subject: GI referral                                      Patient was seen in er for epigastric pain.  She stated [facility]BP said she needed to see a GI doctor at [facility].  She would like a urgent referral to the GI doctor that CBP recommended.

## 2020-12-28 ENCOUNTER — Encounter: Admit: 2020-12-28 | Discharge: 2020-12-28 | Payer: MEDICARE

## 2020-12-28 NOTE — Telephone Encounter
-----   Message from Matt Holmes sent at 12/27/2020  3:06 PM CST -----  Regarding: RE: Records  Hi Larita Fife,  The available records are in the patient's chart now.    Thank you,  Barb  ----- Message -----  From: Myrlene Broker, RN  Sent: 12/03/2020   3:25 PM CST  To: Matt Holmes  Subject: Records                                          Barb,  Please request new patient records from a Dr. Broadus John, Surgeon in [place], [place].  Looking for hiatal hernia op note.  Last office note.  Last EGDs, swallow studies and CT Scan.     Was in the ED recently at [facility] and would like those records too.      Probably should obtain colonoscopy report and pathology if done just in case.      This is for 01/10/2021 appointment with Dr. Janene Madeira.    Thanks! Larita Fife

## 2021-01-05 ENCOUNTER — Encounter: Admit: 2021-01-05 | Discharge: 2021-01-05 | Payer: MEDICARE

## 2021-01-05 ENCOUNTER — Ambulatory Visit: Admit: 2021-01-05 | Discharge: 2021-01-05 | Payer: MEDICARE

## 2021-01-05 DIAGNOSIS — M797 Fibromyalgia: Secondary | ICD-10-CM

## 2021-01-05 DIAGNOSIS — R002 Palpitations: Secondary | ICD-10-CM

## 2021-01-05 DIAGNOSIS — I2510 Atherosclerotic heart disease of native coronary artery without angina pectoris: Secondary | ICD-10-CM

## 2021-01-05 DIAGNOSIS — E782 Mixed hyperlipidemia: Secondary | ICD-10-CM

## 2021-01-05 DIAGNOSIS — J479 Bronchiectasis, uncomplicated: Secondary | ICD-10-CM

## 2021-01-05 DIAGNOSIS — Z9889 Other specified postprocedural states: Secondary | ICD-10-CM

## 2021-01-05 NOTE — Patient Instructions
Stop Rosuvastatin.     Please have your lipid panel drawn 3 months, this was ordered today. You can have this drawn in [place].  We may start another medication for your cholesterol.      Plan for a follow up clinic visit in 9 months with Dr. Hale Bogus, or sooner for questions or concerns.

## 2021-01-05 NOTE — Progress Notes
Date of Service: 01/05/2021    CASAUNDRA TAKACS is a 80 y.o. female.       HPI     TUYEN UNCAPHER presents to the office today for cardiovascular follow up.  She is a 80 y.o. female with a past medical history of palpitations and aortic valve sclerosis.  She follows with Dr. Hale Bogus and was last seen on July 07, 2020.  While she was vacationing in [place] last winter she had an episode of syncope in her cardiologist placed a Holter monitor that revealed brief runs of SVT and she was initiated on metoprolol XL 50 mg daily that was reduced to 25 mg daily due to fatigue, then ultimately stopped.    I last saw her in the office on August 23, 2020 after she called with increasing complaints of palpitations after her beta-blocker was reduced.  I resumed her metoprolol XL 12.5 mg once daily.  She then saw Dr. Hale Bogus on October 18, 2020 with complaints of chest pain and rapid heartbeats.  He decided to proceed with a coronary angiogram that was completed on November 10, 2020 that revealed 30 to 40% stenosis of the mid  Go, 20% stenosis of the ramus intermedius, 20 to 30% stenosis of the proximal circumflex artery and 30 to 40% stenosis of the right coronary artery.  Medical management was pursued at that time.    She comes the office with her husband today and has had no further complaints of chest pain episodes with continues to struggle with ongoing gastrointestinal complaints of nausea, diarrhea and abdominal bloating.  She is only able to eat for 3 days at a time, then she has to stop and allow her body to digest her food before she is able to eat again.  This causes her much abdominal discomfort and she was referred to GI for an appointment next week for further follow-up.  She is able complete her activities of daily living at home, including extensive Christmas decorating at her house recently.  She has been following with pulmonology and made no medication changes in terms of her bronchiectasis.         Vitals: 01/05/21 1413   BP: 128/68   BP Source: Arm, Right Upper   Pulse: 70   SpO2: 96%   O2 Device: None (Room air)   PainSc: Zero   Weight: 67.1 kg (148 lb)   Height: 163.8 cm (5' 4.5)     Body mass index is 25.01 kg/m?[name]     Past Medical History  Patient Active Problem List    Diagnosis Date Noted   ? Coronary artery disease involving native coronary artery of native heart without angina pectoris 01/05/2021   ? Angina pectoris (HCC) 10/18/2020   ? 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 Esophageal hernia repairs, 2012, 2020 08/13/2018     2012 Initial Mesh repair [name], [name]  July 1,2020 Laparoscopic Revision of repair [name], Dr [name] surgeons. [facility]     ? Mild aortic sclerosis 09/07/2015     noted on exam 08/07/15  03/26/20 echo [facility] heart clinic after syncope shows normal aortic valve no sclerosis no stenosis.  EF 60 to 65% no valve disease.  Septum 1.67 cm posterior wall 1.44 cm     ? 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 She has a IgG level of 158, overseen by [name],      ?  Bronchiectasis without complication (HCC) 02/10/2014     bronchiectasis  Noted by [name].      ? Hepatic steatosis 02/10/2014     2015: CT abdomen: mild fatty infiltration of the liver, mild intrahepatic ductal dilatation.  cholecystectomy. There was mild extrahepatic ductal dilatation, common bile duct was 5.6 mm and on prior CT of 4.4 mm. Suggestion of some mild wall thickening of the distal common duct. There were no hepatic filling defects seen and no adenopathy was seen.Impressions:   fatty infiltration of the liver, post cholecystectomy with ectasia of the bile duct, and mild thickening of the wall of the common duct. In the presence of mildly elevated liver enzymes without elevated bilirubin a biopsy should be considered. No focal solid organ abnormality, a small hiatal hernia, mild left colon diverticulosis, post hysterectomy changes of the bladder.       ? Palpitation 12/14/2011     2007 palpitations, resolved spontanously  11/13 recurrent palpitations, Eval MAC [name]:K+=4.7, EchoDop: EF 60%, normal LA, No TR, PAP not measurable, Valves OK no effusion.  32 day  event monitor: 5episodes of pounding heartbeats and fluttering palpitations associated w/ RBBB single  PVCs.  06/27/13 8 1/2 minute Bruce Exercise Echo EF 55%, no ischemia. Valves OK.  HR>100% target, rare PVC, stopped w/ fatigue, no ST changes or arrhythmias.            Review of Systems   Constitutional: Negative.   HENT: Negative.    Eyes: Negative.    Cardiovascular: Negative.    Respiratory: Positive for shortness of breath (loses breath while sitting down).    Endocrine: Negative.    Hematologic/Lymphatic: Negative.    Skin: Negative.    Musculoskeletal: Negative.    Gastrointestinal: Negative.    Genitourinary: Negative.    Neurological: Positive for dizziness (after episode of losing breath ).   Psychiatric/Behavioral: Negative.    Allergic/Immunologic: Negative.        Physical Exam  General Appearance: no acute distress  Skin: warm & intact  HEENT: unremarkable  Neck Veins: neck veins are flat & not distended  Carotid Arteries: no bruits  Chest Inspection: chest is normal in appearance  Auscultation/Percussion: lungs clear to auscultation, no rales, rhonchi, or wheezing  Cardiac Rhythm: regular rhythm & normal rate  Cardiac Auscultation: Normal S1 & S2, no S3 or S4, no rub  Murmurs: no cardiac murmurs   Extremities: no lower extremity edema; 2+ symmetric distal pulses  Abdominal Exam: soft, non-tender, no masses, bowel sounds normal  Neurologic Exam: oriented to time, place and person; no focal neurologic deficits  Psychiatric: Normal mood and affect.  Behavior is normal. Judgment and thought content normal.       Cardiovascular Studies      Cardiovascular Health Factors  Vitals BP Readings from Last 3 Encounters:   01/05/21 128/68   11/10/20 125/60 10/18/20 118/64     Wt Readings from Last 3 Encounters:   01/05/21 67.1 kg (148 lb)   11/10/20 65.4 kg (144 lb 2.9 oz)   10/18/20 68.3 kg (150 lb 9.6 oz)     BMI Readings from Last 3 Encounters:   01/05/21 25.01 kg/m?   11/10/20 24.37 kg/m?   10/18/20 25.06 kg/m?      Smoking Social History     Tobacco Use   Smoking Status Never   Smokeless Tobacco Never      Lipid Profile Cholesterol   Date Value Ref Range Status   11/10/2020 209 (H) <200  MG/DL Final     HDL   Date Value Ref Range Status   11/10/2020 54 >40 MG/DL Final     LDL   Date Value Ref Range Status   11/10/2020 127 (H) <100 mg/dL Final     Triglycerides   Date Value Ref Range Status   11/10/2020 120 <150 MG/DL Final      Blood Sugar No results found for: HGBA1C  Glucose   Date Value Ref Range Status   11/10/2020 100 70 - 100 MG/DL Final   14/78/2956 99 70 - 100 MG/DL Final   21/30/8657 846 (H) 83 - 110 Final          Problems Addressed Today  Encounter Diagnoses   Name Primary?   ? Mixed hyperlipidemia Yes   ? Palpitation    ? Coronary artery disease involving native coronary artery of native heart without angina pectoris    ? Bronchiectasis without complication (HCC)    ? Esophageal hernia repairs, 2012, 2020        Assessment and Plan     1.  Coronary artery disease.  She proceeded with a coronary angiogram in October 2022 that revealed nonobstructive coronary disease and medical management has been pursued, details above.  Given the lack of significant coronary disease, her symptoms are felt to be attributed to her underlying gastrointestinal issues, she has an upcoming appointment for further evaluation.  She is on secondary prevention with aspirin, statin and beta-blocker therapy.    2.  Bronchiectasis.  She follows with [name] and [facility] pulmonology group.    3.  Palpitations.  On previous ambulatory heart monitor she has had episodes of PVCs and SVT.  Her palpitations have improved after her beta-blocker was resumed and we will continue at 12.5 mg once daily.    4.  Hyperlipidemia.  She was previously taking rosuvastatin 10 mg daily but stopped recently due to complaints of myalgias.  This is now improved.  I have ordered repeat fasting lipid Ifeoma she will have drawn this winter while she is vacationing in [place].  Based on her values we should consider PCSK9 inhibitor due to her statin intolerance.    She will follow-up with Dr. Hale Bogus next summer when she returns to [facility].    Judieth Keens, APRN-NP      Current Medications (including today's revisions)  ? aspirin EC (ASPIR-LOW) 81 mg tablet Take one tablet by mouth daily. Take with food.   ? cholecalciferol (vitamin D3) (VITAMIN D3) 1,000 units tablet Take 1,000 Units by mouth daily.   ? metoprolol XL (TOPROL XL) 25 mg extended release tablet Take one-half tablet by mouth at bedtime daily.   ? nitroglycerin (NITROSTAT) 0.3 mg tablet Place one tablet under tongue every 5 minutes as needed for Chest Pain.   ? progesterone, micronized (PROMETRIUM) 200 mg capsule Take 200 mg by mouth daily.   ? zinc sulfate 220 mg (50 mg elemental zinc) capsule Take 220 mg by mouth daily.

## 2021-01-10 ENCOUNTER — Encounter: Admit: 2021-01-10 | Discharge: 2021-01-10 | Payer: MEDICARE

## 2021-01-10 DIAGNOSIS — R002 Palpitations: Secondary | ICD-10-CM

## 2021-01-10 DIAGNOSIS — M797 Fibromyalgia: Secondary | ICD-10-CM

## 2021-01-21 ENCOUNTER — Encounter: Admit: 2021-01-21 | Discharge: 2021-01-21 | Payer: MEDICARE

## 2021-01-21 NOTE — Telephone Encounter
She can take Imodium to stop diarrhea, but then I agree to restart Miralax 1 scoop every other day. She needs to keep bowel moving regularly.

## 2021-01-21 NOTE — Telephone Encounter
Patient message back had actually stopped Miralax due to "draining" and now is taking imodium due to upcoming Holidays.  Asking for next steps.

## 2021-01-21 NOTE — Telephone Encounter
Miralax once daily and having and "mess" for the last 4-5 days.  Message to patient could go to every other day to see if this helps and update our office next week.

## 2021-07-01 ENCOUNTER — Encounter: Admit: 2021-07-01 | Discharge: 2021-07-01 | Payer: MEDICARE

## 2021-07-07 ENCOUNTER — Encounter: Admit: 2021-07-07 | Discharge: 2021-07-07 | Payer: MEDICARE

## 2021-07-18 ENCOUNTER — Encounter: Admit: 2021-07-18 | Discharge: 2021-07-18 | Payer: MEDICARE

## 2021-07-18 ENCOUNTER — Ambulatory Visit: Admit: 2021-07-18 | Discharge: 2021-07-19 | Payer: MEDICARE

## 2021-07-18 ENCOUNTER — Ambulatory Visit: Admit: 2021-07-18 | Discharge: 2021-07-18 | Payer: MEDICARE

## 2021-07-18 DIAGNOSIS — R197 Diarrhea, unspecified: Secondary | ICD-10-CM

## 2021-07-18 DIAGNOSIS — R1084 Generalized abdominal pain: Secondary | ICD-10-CM

## 2021-07-18 DIAGNOSIS — R110 Nausea: Secondary | ICD-10-CM

## 2021-07-18 DIAGNOSIS — K760 Fatty (change of) liver, not elsewhere classified: Secondary | ICD-10-CM

## 2021-07-18 DIAGNOSIS — R002 Palpitations: Secondary | ICD-10-CM

## 2021-07-18 DIAGNOSIS — R634 Abnormal weight loss: Secondary | ICD-10-CM

## 2021-07-18 DIAGNOSIS — M797 Fibromyalgia: Secondary | ICD-10-CM

## 2021-07-18 DIAGNOSIS — K5901 Slow transit constipation: Secondary | ICD-10-CM

## 2021-07-18 DIAGNOSIS — Z9889 Other specified postprocedural states: Secondary | ICD-10-CM

## 2021-07-18 DIAGNOSIS — K3184 Gastroparesis: Secondary | ICD-10-CM

## 2021-07-18 DIAGNOSIS — K5900 Constipation, unspecified: Secondary | ICD-10-CM

## 2021-07-18 DIAGNOSIS — K9289 Other specified diseases of the digestive system: Secondary | ICD-10-CM

## 2021-07-18 MED ORDER — PEG 3350-ELECTROLYTES 236-22.74-6.74 -5.86 GRAM PO SOLR
0 refills | 1.00000 days | Status: AC
Start: 2021-07-18 — End: ?

## 2021-07-18 MED ORDER — METOCLOPRAMIDE HCL 5 MG/5 ML PO SOLN
5 mg | Freq: Before meals | ORAL | 0 refills | Status: AC
Start: 2021-07-18 — End: ?

## 2021-07-19 ENCOUNTER — Ambulatory Visit: Admit: 2021-07-19 | Discharge: 2021-07-19 | Payer: MEDICARE

## 2021-07-19 DIAGNOSIS — R197 Diarrhea, unspecified: Secondary | ICD-10-CM

## 2021-07-19 DIAGNOSIS — R634 Abnormal weight loss: Secondary | ICD-10-CM

## 2021-07-19 DIAGNOSIS — R110 Nausea: Secondary | ICD-10-CM

## 2021-07-19 DIAGNOSIS — K5900 Constipation, unspecified: Secondary | ICD-10-CM

## 2021-07-21 ENCOUNTER — Encounter: Admit: 2021-07-21 | Discharge: 2021-07-21 | Payer: MEDICARE

## 2021-07-21 ENCOUNTER — Ambulatory Visit: Admit: 2021-07-21 | Discharge: 2021-07-21 | Payer: MEDICARE

## 2021-07-21 DIAGNOSIS — K3184 Gastroparesis: Secondary | ICD-10-CM

## 2021-07-21 DIAGNOSIS — T7840XA Allergy, unspecified, initial encounter: Secondary | ICD-10-CM

## 2021-07-21 DIAGNOSIS — R197 Diarrhea, unspecified: Secondary | ICD-10-CM

## 2021-07-21 LAB — GIARDIA SCREEN,FECAL

## 2021-07-21 LAB — LEUKOCYTES, FECAL

## 2021-07-21 LAB — CRYPTOSPORIDIUM, FECAL: CRYPTO EIA: NEGATIVE

## 2021-07-21 LAB — C DIFFICILE BY PCR: C. DIFF TOXIN B PCR: NEGATIVE

## 2021-07-21 LAB — H. PYLORI AG, FECES: H. PYLORI AG, FECES: NEGATIVE

## 2021-07-21 NOTE — Telephone Encounter
Her abd xrays show stool on the left side of colon, lots of gas; no obstruction; she should stop the metoclopramide due to side effects; she has the GET scheduled for 07/26/21. We will hold on further meds until after the GET. Let's do some labs, including cbc, cmp, crp, sed rate, CK. If she has had labs in the last 30 days, we can possibly use those but would still need the CK and with her symptoms, should repeat them anyway.

## 2021-08-03 ENCOUNTER — Encounter: Admit: 2021-08-03 | Discharge: 2021-08-03 | Payer: MEDICARE

## 2021-08-08 ENCOUNTER — Encounter: Admit: 2021-08-08 | Discharge: 2021-08-08 | Payer: MEDICARE

## 2021-08-22 ENCOUNTER — Encounter: Admit: 2021-08-22 | Discharge: 2021-08-22 | Payer: MEDICARE

## 2021-09-07 ENCOUNTER — Encounter: Admit: 2021-09-07 | Discharge: 2021-09-07 | Payer: MEDICARE

## 2021-09-22 ENCOUNTER — Encounter: Admit: 2021-09-22 | Discharge: 2021-09-22 | Payer: MEDICARE

## 2021-09-22 MED ORDER — CLENPIQ 10 MG-3.5 GRAM- 12 GRAM/160 ML PO SOLN
350 mL | Freq: Once | ORAL | 0 refills | Status: AC
Start: 2021-09-22 — End: ?

## 2021-09-22 NOTE — Telephone Encounter
Pt called and requested a low volume prep

## 2021-09-27 ENCOUNTER — Ambulatory Visit: Admit: 2021-09-27 | Discharge: 2021-09-27 | Payer: MEDICARE

## 2021-09-27 ENCOUNTER — Encounter: Admit: 2021-09-27 | Discharge: 2021-09-27 | Payer: MEDICARE

## 2021-09-27 DIAGNOSIS — K3184 Gastroparesis: Secondary | ICD-10-CM

## 2021-09-27 MED ORDER — RP DX TC-99M SULF COLL MCI
1 | Freq: Once | ORAL | 0 refills | Status: CP
Start: 2021-09-27 — End: ?
  Administered 2021-09-27: 14:00:00 1.1 via ORAL

## 2021-10-05 ENCOUNTER — Encounter: Admit: 2021-10-05 | Discharge: 2021-10-05 | Payer: MEDICARE

## 2021-10-05 ENCOUNTER — Ambulatory Visit: Admit: 2021-10-05 | Discharge: 2021-10-05 | Payer: MEDICARE

## 2021-10-05 DIAGNOSIS — E7800 Pure hypercholesterolemia, unspecified: Secondary | ICD-10-CM

## 2021-10-05 DIAGNOSIS — I2510 Atherosclerotic heart disease of native coronary artery without angina pectoris: Secondary | ICD-10-CM

## 2021-10-05 DIAGNOSIS — Z136 Encounter for screening for cardiovascular disorders: Secondary | ICD-10-CM

## 2021-10-05 DIAGNOSIS — R002 Palpitations: Secondary | ICD-10-CM

## 2021-10-05 MED ORDER — ROSUVASTATIN 5 MG PO TAB
ORAL_TABLET | ORAL | 3 refills | 90.00000 days | Status: AC
Start: 2021-10-05 — End: ?

## 2021-10-05 MED ORDER — EZETIMIBE 10 MG PO TAB
10 mg | ORAL_TABLET | Freq: Every day | ORAL | 3 refills | Status: AC
Start: 2021-10-05 — End: ?

## 2021-10-05 NOTE — Progress Notes
Date of Service: 10/05/2021    Robin Braun is a 81 y.o. female.       HPI     Robin Braun returns for annual follow-up with me regarding series less than critical cardiac problems that do merit ongoing attention.  Last year she had some chest pressure that presented in a manner that I thought warranted coronary angiography.  She underwent the cath the next month on October 12 at [place] she had normal coronary arteries with mild coronary atherosclerosis with nothing described as being greater than a 40% lesion.  She had hives on her left arm after the procedure which was attributed to iodine contrast allergic reaction.      Earlier in 2022 she had a syncopal episode with no cause identified.  She had a hist history of palpitations but did not pursue extensive evaluation as I saw her relatively distant from the time of the episode and she had no recurrence.    She has had weight loss with abdominal pain its associated with some types of chest pain.  Some of this began with revision of esophageal hernia repair in [place].  She is under the care of Dr. Jesusita Oka buckles to evaluate this.  She tells me he is planning colonoscopy because he wants to fully evaluate the lower tract as he tries to explore issues that could be related to the upper tract as well. She has had 2 surgeries related to her esophageal hernia.  The first was in 2012 at [place]. [facility] that seemed successful to her at the time.  She had a revision in July 2020 at [facility] that has been followed by continuation of other problems..  She is seeing Dan buckles at [place] to try to resolve her GI issues.  She also has a GI specialist in [place] where she spends 6 months a year who is in communication with Dr. Janene Madeira so that between the 2 of them they can provide comprehensive effective care for her whether she is in [place] or [place].    She defines the chest symptoms that I was concerned about his cardiac last year as  being related to GI distress with inability to belch because of her issues related after esophageal hernia repair.  At that point she told me that she had some episodes where she gets retrosternal chest pain when she is going upstairs     When I reviewed her lipid profile from October 2022 when she had the cath in related to 22% 10-year risk she told me that she has been losing weight can eat and she suspects her lipids look far different now since her GI distress has progressed.  She developed myalgias when starting the Crestor 10 daily that was prescribed at the time of her cath when she had moderate coronary disease defined she has not restarted an alternative dose or formulation of statin   Latest Reference Range & Units 11/10/20 10:53   Cholesterol <200 MG/DL 454 (H)   Triglycerides <150 MG/DL 098   HDL >11 MG/DL 54   LDL <914 mg/dL 782 (H)       Vitals:    10/05/21 1451   BP: 122/72   BP Source: Arm, Left Upper   Pulse: 64   PainSc: Zero   Weight: 67.1 kg (148 lb)   Height: 163.8 cm (5' 4.49)     Body mass index is 25.02 kg/m?[name]     Past Medical History  Patient Active Problem List  Diagnosis Date Noted   ? Coronary artery disease involving native coronary artery of native heart without angina pectoris 01/05/2021   ? Angina pectoris (HCC) 10/18/2020   ? 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 Esophageal hernia repairs, 2012, 2020 08/13/2018     2012 Initial Mesh repair [name], [name]  July 1,2020 Laparoscopic Revision of repair [name], Dr [name] surgeons. [facility]     ? 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  EF 60 to 65% no valve disease.  Septum 1.67 cm posterior wall 1.44 cm     ? 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 She has a IgG level of 158, overseen by [name],      ? Bronchiectasis without complication (HCC) 02/10/2014     bronchiectasis  Noted by [name].      ? Hepatic steatosis 02/10/2014     2015: CT abdomen: mild fatty infiltration of the liver, mild intrahepatic ductal dilatation.  cholecystectomy. There was mild extrahepatic ductal dilatation, common bile duct was 5.6 mm and on prior CT of 4.4 mm. Suggestion of some mild wall thickening of the distal common duct. There were no hepatic filling defects seen and no adenopathy was seen.Impressions:   fatty infiltration of the liver, post cholecystectomy with ectasia of the bile duct, and mild thickening of the wall of the common duct. In the presence of mildly elevated liver enzymes without elevated bilirubin a biopsy should be considered. No focal solid organ abnormality, a small hiatal hernia, mild left colon diverticulosis, post hysterectomy changes of the bladder.       ? Palpitation 12/14/2011     2007 palpitations, resolved spontanously  11/13 recurrent palpitations, Eval MAC [name]:K+=4.7, EchoDop: EF 60%, normal LA, No TR, PAP not measurable, Valves OK no effusion.  32 day  event monitor: 5episodes of pounding heartbeats and fluttering palpitations associated w/ RBBB single  PVCs.  06/27/13 8 1/2 minute Bruce Exercise Echo EF 55%, no ischemia. Valves OK.  HR>100% target, rare PVC, stopped w/ fatigue, no ST changes or arrhythmias.            Review of Systems   Constitutional: Negative.   HENT: Negative.    Eyes: Negative.    Cardiovascular: Negative.    Respiratory: Negative.    Endocrine: Negative.    Hematologic/Lymphatic: Negative.    Skin: Negative.    Musculoskeletal: Negative.    Gastrointestinal: Negative.    Genitourinary: Negative.    Neurological: Negative.    Psychiatric/Behavioral: Negative.    Allergic/Immunologic: Negative.    All other systems reviewed and are negative.      Physical Exam  General: Patient in no distress, looks generally healthy. Skin warm and dry, non icteric,  Wearing medical face mask   Mucous membranes moist. Pupils equal and round  Carotids: no bruits    Thyroid not enlarged.  Neck veins: CVP <6 normal, no V wave, no HJR     Respiratory: Breathing comfortably. Lungs clear to percussion & auscultation. No rales, rhonchi or wheezing   Cardiac: Regular rhythm. LV impulse not palpable. Normal S1 & S2, Fourth heart sound, no rub or S3.  Gr i/vi left parasternal systolic murmur. No diastolic murmur   Abdomen: soft, non-tender, no masses,bruits,hepatic or aortic enlargement. + bowel  sounds.   Femoral arteries: Good pulses, no bruits.  Legs/feet: Normal PT pulses, no edema.   Motor: Normal muscle strength. Cognitive: Pleasant demeanor. Good insight. No depression    Cardiovascular Studies  EKG shows sinus rhythm rate 64 left axis deviation otherwise unremarkable.  The 10-year ASCVD risk score (Arnett DK, et al., 2019) is: 22.1%    Values used to calculate the score:      Age: 83 years      Sex: Female      Is Non-Hispanic African American: No      Diabetic: No      Tobacco smoker: No      Systolic Blood Pressure: 122 mmHg      Is BP treated: No      HDL Cholesterol: 54 MG/DL      Total Cholesterol: 209 MG/DL    Cardiovascular Health Factors  Vitals BP Readings from Last 3 Encounters:   10/05/21 122/72   01/05/21 128/68   11/10/20 125/60     Wt Readings from Last 3 Encounters:   10/05/21 67.1 kg (148 lb)   07/18/21 64.4 kg (142 lb)   01/05/21 67.1 kg (148 lb)     BMI Readings from Last 3 Encounters:   10/05/21 25.02 kg/m?   07/18/21 24.01 kg/m?   01/05/21 25.01 kg/m?      Smoking Social History     Tobacco Use   Smoking Status Never   Smokeless Tobacco Never      Lipid Profile Cholesterol   Date Value Ref Range Status   11/10/2020 209 (H) <200 MG/DL Final     HDL   Date Value Ref Range Status   11/10/2020 54 >40 MG/DL Final     LDL   Date Value Ref Range Status   11/10/2020 127 (H) <100 mg/dL Final     Triglycerides   Date Value Ref Range Status   11/10/2020 120 <150 MG/DL Final      Blood Sugar No results found for: HGBA1C  Glucose   Date Value Ref Range Status   11/10/2020 100 70 - 100 MG/DL Final   16/10/9602 99 70 - 100 MG/DL Final   54/09/8117 147 (H) 83 - 110 Final          Problems Addressed Today  Encounter Diagnoses   Name Primary?   ? Coronary artery disease involving native coronary artery of native heart without angina pectoris Yes   ? Screening for heart disease        Assessment and Plan     Robin Braun has moderate coronary disease and should be on a statin.  I have recommended that she try Crestor 5 mg 3 days weekly along with Zetia 10 daily.  This should give her much lower likelihood of statin related myalgias that she is only taking 15 mg weekly rather than the 70 mg weekly it was prescribed as 10 mg daily last year.  Like her to recheck her cholesterol in 3 months.  She can do this in [place].    She is physically active and daily living.  I think she is physically active for the 2 and half hours a week of exercise as I generally recommend.  Her blood pressure is consistently under 130/80.  Her BMI 24 now is associated with the best survival.  She may have a tendency to gain weight if her GI issues are improved but I told her she should try to stay at the weight she is currently possible.  She has a history of palpitations although she has none currently.  Told her that she could likely stop the Toprol-XL 12.5 and see minimal if any adverse effect with regard to recurrent palpitations    I do not think I need to see her again for a year.  She will be going to [place] to spend the winter months there and return in the spring so she can schedule with me sometime in mid or late 2024.    Patient Instructions   You had medium coronary disease at cath late last year.  We tried 70 mg a week of Crestor 10 mg daily.  I want to cut that back to 5 mg Monday Wednesday Friday.  We might boost that up to 5 mg daily but for now lets just go with 3 times a week Monday Wednesday Friday.  There is a booster drug called Zetia 10 mg daily that can double the benefit of Crestor.  If you start taking this you may get the treatment effect of 10 mg or maybe 20 mg a day but you are only taking 5 mg of Crestor 3 days a week.  Magnifies the benefit of the Crestor.       When you get near the end of the 90-day supply of Zetia have a cholesterol checked in [place].  If you start to get muscle aches I'll be surprised.   In that case we may just go with Zetia by itself 10 mg daily.  It will not be as effective as it is when you are taking it with a statin but it would be better than nothing    I do not need to tell you to stay active 2 and half hours a week.    Your healthy and otherwise.  Your blood pressure is always under 130/80.  Your body mass index is around 24 which is the BMI that gives the best survival.  You do not need to gain weight or lose weight.  She lost 30 pounds because of your stomach I would not set out to gain another 30 pounds which are feeling better.    You are taking the Toprol-XL pill half tablet daily to suppress irregular heart rates and rhythms.  I do not know whether you really need this.  You can just stop it or take it every other day or gradually get yourself off of it.  If you feel better when you are taking it you can resume but I do not think you need to stay on that forever.    See me next summer.    It's good to see you today.   My Chart is the best way to communicate with [place] but if you prefer, call in if you have problems or questions.   Marissa Nestle, MD              Current Medications (including today's revisions)  ? cholecalciferol (vitamin D3) (VITAMIN D3) 1,000 units tablet Take five tablets by mouth daily.   ? cyanocobalamin (vitamin B-12) (VITAMIN B12 PO) Take 1 tablet by mouth daily.   ? ferrous sulfate (FEOSOL) 325 mg (65 mg iron) tablet Take one tablet by mouth daily. Take on an empty stomach at least 1 hour before or 2 hours after food.   ? metoprolol XL (TOPROL XL) 25 mg extended release tablet Take one-half tablet by mouth at bedtime daily.   ? omeprazole DR (PRILOSEC) 20 mg capsule Take one capsule by  mouth daily before breakfast.   ? peg 3350-electrolytes (GOLYTELY) 236-22.74-6.74 -5.86 gram oral solution Split dose by mouth as directed in My Chart.   ? progesterone, micronized (PROMETRIUM) 200 mg capsule Take one capsule by mouth daily.   ? sucralfate (CARAFATE) 1 gram tablet Take one tablet by mouth three times daily.   ? zinc sulfate 220 mg (50 mg elemental zinc) capsule Take one capsule by mouth daily.

## 2021-10-06 ENCOUNTER — Encounter: Admit: 2021-10-06 | Discharge: 2021-10-06 | Payer: MEDICARE

## 2021-10-11 ENCOUNTER — Encounter: Admit: 2021-10-11 | Discharge: 2021-10-11 | Payer: MEDICARE

## 2021-10-11 ENCOUNTER — Ambulatory Visit: Admit: 2021-10-11 | Discharge: 2021-10-11 | Payer: MEDICARE

## 2021-10-11 DIAGNOSIS — M797 Fibromyalgia: Secondary | ICD-10-CM

## 2021-10-11 DIAGNOSIS — R002 Palpitations: Secondary | ICD-10-CM

## 2021-10-11 MED ORDER — PROPOFOL INJ 10 MG/ML IV VIAL
INTRAVENOUS | 0 refills | Status: DC
Start: 2021-10-11 — End: 2021-10-11
  Administered 2021-10-11: 13:00:00 30 mg via INTRAVENOUS
  Administered 2021-10-11: 13:00:00 50 mg via INTRAVENOUS

## 2021-10-11 MED ORDER — PROPOFOL 10 MG/ML IV EMUL 20 ML (INFUSION)(AM)(OR)
INTRAVENOUS | 0 refills | Status: DC
Start: 2021-10-11 — End: 2021-10-11
  Administered 2021-10-11: 13:00:00 140 ug/kg/min via INTRAVENOUS

## 2021-10-11 MED ORDER — FENTANYL CITRATE (PF) 50 MCG/ML IJ SOLN
INTRAVENOUS | 0 refills | Status: DC
Start: 2021-10-11 — End: 2021-10-11
  Administered 2021-10-11: 13:00:00 25 ug via INTRAVENOUS

## 2021-10-11 MED ORDER — LIDOCAINE (PF) 20 MG/ML (2 %) IJ SOLN
INTRAVENOUS | 0 refills | Status: DC
Start: 2021-10-11 — End: 2021-10-11
  Administered 2021-10-11: 13:00:00 100 mg via INTRAVENOUS

## 2021-10-11 MED ADMIN — ONABOTULINUMTOXINA 100 UNIT IJ SOLR [77983]: 200 [IU] | INTRAMUSCULAR | @ 14:00:00 | Stop: 2021-10-11 | NDC 00023114501

## 2021-10-11 MED ADMIN — LACTATED RINGERS IV SOLP [4318]: 1000.000 mL | INTRAVENOUS | @ 13:00:00 | Stop: 2021-10-11 | NDC 00338011704

## 2021-10-11 NOTE — Anesthesia Pre-Procedure Evaluation
Anesthesia Pre-Procedure Evaluation    Name: Robin Braun      MRN: [id number]     DOB: 1940/06/28     Age: 81 y.o.     Sex: female   _________________________________________________________________________     Procedure Info:   Procedure Information     Date/Time: 10/11/21 0835    Procedures:       ESOPHAGOGASTRODUODENOSCOPY WITH SPECIMEN COLLECTION BY BRUSHING/ WASHING [[id number]]      COLONOSCOPY DIAGNOSTIC WITH SPECIMEN COLLECTION BY BRUSHING/ WASHING - FLEXIBLE    Location: ENDO 4 / ENDO/GI    Surgeons: Buckles, Vinnie Level, MD          Physical Assessment  Vital Signs (last filed in past 24 hours):         Patient History   Allergies   Allergen Reactions   ? Iv Contrast Dye [Iodinated Contrast Media] HIVES     Hives following contrast administration during coronary angiography on 11/10/2020   ? Rosuvastatin MUSCLE PAIN   ? Celebrex [Celecoxib] ANXIETY   ? Codeine UNKNOWN   ? Lyrizine ANXIETY   ? Morphine UNKNOWN   ? Vibramycin [Doxycycline Calcium] ANXIETY        Current Medications    Medication Directions   cholecalciferol (vitamin D3) (VITAMIN D3) 1,000 units tablet Take five tablets by mouth daily.   cyanocobalamin (vitamin B-12) (VITAMIN B12 PO) Take 1 tablet by mouth daily.   ezetimibe (ZETIA) 10 mg tablet Take one tablet by mouth daily.   ferrous sulfate (FEOSOL) 325 mg (65 mg iron) tablet Take one tablet by mouth daily. Take on an empty stomach at least 1 hour before or 2 hours after food.   metoprolol XL (TOPROL XL) 25 mg extended release tablet Take one-half tablet by mouth at bedtime daily.   omeprazole DR (PRILOSEC) 20 mg capsule Take one capsule by mouth daily before breakfast.   peg 3350-electrolytes (GOLYTELY) 236-22.74-6.74 -5.86 gram oral solution Split dose by mouth as directed in My Chart.   progesterone, micronized (PROMETRIUM) 200 mg capsule Take one capsule by mouth daily.   rosuvastatin (CRESTOR) 5 mg tablet one daily or as directed   sucralfate (CARAFATE) 1 gram tablet Take one tablet by mouth three times daily.   zinc sulfate 220 mg (50 mg elemental zinc) capsule Take one capsule by mouth daily.       Review of Systems/Medical History      Patient summary reviewed    PONV Screening: Non-smoker, Postoperative opioids and Female sex  No history of anesthetic complications  No family history of anesthetic complications        Pulmonary          Asthma (no inhaler use for > 1 year)      Cardiovascular         Beta Blocker therapy: No      Beta blockers within 24 hours: n/a      No valvular problems/murmurs            No hx of coronary artery disease (pt denies)          Dysrhythmias (hx palpitations after covid- resolved)      Hyperlipidemia      GI/Hepatic/Renal           Liver disease (fatty liver):           Bowel prep        Nausea and abdominal pain with eating after hernia repair        Musculoskeletal  Fibromyalgia        Endocrine/Other               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    Neurological Findings:       Alert and oriented x 3    Constitutional findings:       No acute distress      Well-developed       Diagnostic Tests  Stress ECHO 05/2013  Reading Physician Reading Date Result Priority   Earl Lites, MD  [phone]  [phone] 06/27/2013 Routine     Narrative  Normal chamber sizes   Normal left ventricular systolic function   Unremarkable cardiac valve structures and function other than mild MV   regurgitation   Unable to calculate pulmonary artery pressure due to inadequate tricuspid   regurgitation.   No significant pericardial effusion     Post Exercise Echo   Hyperdynamic left ventricular response to exercise   Ejection fraction increases with exercise   Left ventricular volume decreased   No new regional wall motion abnormalities     The stress echocardiogram was negative for ischemia     Hematology:   Lab Results   Component Value Date    HGB 12.1 11/10/2020    HCT 36.0 11/10/2020    PLTCT 248 11/10/2020    WBC 9.7 11/10/2020    NEUT 69 10/18/2020    ANC 5.50 10/18/2020    ALC 1.22 10/18/2020    MONA 9 10/18/2020    AMC 0.75 10/18/2020    EOSA 6 10/18/2020    ABC 0.08 10/18/2020    MCV 88.9 11/10/2020    MCH 29.8 11/10/2020    MCHC 33.5 11/10/2020    MPV 9.8 11/10/2020    RDW 13.2 11/10/2020         General Chemistry:   Lab Results   Component Value Date    NA 141 11/10/2020    K 4.2 11/10/2020    CL 104 11/10/2020    CO2 27 11/10/2020    GAP 10 11/10/2020    BUN 12 11/10/2020    CR 0.74 11/10/2020    GLU 100 11/10/2020    CA 9.3 11/10/2020    ALBUMIN 4.0 10/18/2020    MG 2.2 06/17/2013    TOTBILI 0.3 10/18/2020      Coagulation: No results found for: PT, PTT, INR    PAC Plan    Anesthesia Plan    ASA score: 2   Plan: MAC  Induction method: intravenous  NPO status: acceptable      Informed Consent  Anesthetic plan and risks discussed with patient.    Blood Consent: consented      Plan discussed with: anesthesiologist.      Alerts

## 2021-10-11 NOTE — Anesthesia Post-Procedure Evaluation
Post-Anesthesia Evaluation    Name: Robin Braun      MRN: [id number]     DOB: 04/25/1940     Age: 81 y.o.     Sex: female   __________________________________________________________________________     Procedure Information     Anesthesia Start Date/Time: 10/11/21 0816    Procedures:       ESOPHAGOGASTRODUODENOSCOPY WITH SPECIMEN COLLECTION BY BRUSHING/ WASHING [[id number]]      COLONOSCOPY WITH BIOPSY - FLEXIBLE      ESOPHAGOGASTRODUODENOSCOPY WITH DIRECTED SUBMUCOSAL INJECTION - FLEXIBLE    Location: ENDO 4 / ENDO/GI    Surgeons: Buckles, Vinnie Level, MD          Post-Anesthesia Vitals  BP: 126/64 (09/12 0900)  Temp: 36.3 C (97.3 F) (09/12 0851)  Pulse: 73 (09/12 0900)  Respirations: 14 PER MINUTE (09/12 0851)  SpO2: 98 % (09/12 0900)  SpO2 Pulse: 70 (09/12 0900)  O2 Device: None (Room air) (09/12 0851)   Vitals Value Taken Time   BP 126/64 10/11/21 0900   Temp     Pulse 73 10/11/21 0900   Respirations     SpO2 98 % 10/11/21 0900   O2 Device     ABP     ART BP           Post Anesthesia Evaluation Note    Evaluation location: Pre/Post  Patient participation: recovered; patient participated in evaluation  Level of consciousness: alert  Pain management: adequate    Hydration: normovolemia  Temperature: 36.0C - 38.4C  Airway patency: adequate    Perioperative Events       Post-op nausea and vomiting: no PONV    Postoperative Status  Cardiovascular status: hemodynamically stable  Respiratory status: spontaneous ventilation  Follow-up needed: none        Perioperative Events  There were no known notable events for this encounter.

## 2021-10-12 ENCOUNTER — Encounter: Admit: 2021-10-12 | Discharge: 2021-10-12 | Payer: MEDICARE

## 2021-10-12 DIAGNOSIS — R002 Palpitations: Secondary | ICD-10-CM

## 2021-10-12 DIAGNOSIS — M797 Fibromyalgia: Secondary | ICD-10-CM

## 2021-11-21 ENCOUNTER — Encounter: Admit: 2021-11-21 | Discharge: 2021-11-21 | Payer: MEDICARE

## 2021-11-30 ENCOUNTER — Encounter: Admit: 2021-11-30 | Discharge: 2021-11-30 | Payer: MEDICARE

## 2021-11-30 DIAGNOSIS — K5900 Constipation, unspecified: Secondary | ICD-10-CM

## 2021-11-30 MED ORDER — PEG 3350-ELECTROLYTES 236-22.74-6.74 -5.86 GRAM PO SOLR
0 refills | 1.00000 days | Status: AC
Start: 2021-11-30 — End: ?

## 2021-11-30 NOTE — Telephone Encounter
Pt LVM stating that she has not had a BM in 17 days. Placed order for KUB and golytely bowel cleanse. Called pt with no answer. LVM and sent a My Chart message

## 2021-12-01 ENCOUNTER — Encounter: Admit: 2021-12-01 | Discharge: 2021-12-01 | Payer: MEDICARE

## 2021-12-29 ENCOUNTER — Encounter: Admit: 2021-12-29 | Discharge: 2021-12-29 | Payer: MEDICARE

## 2022-01-03 ENCOUNTER — Ambulatory Visit: Admit: 2022-01-03 | Discharge: 2022-01-03 | Payer: MEDICARE

## 2022-01-03 ENCOUNTER — Ambulatory Visit: Admit: 2022-01-03 | Discharge: 2022-01-04 | Payer: MEDICARE

## 2022-01-03 ENCOUNTER — Encounter: Admit: 2022-01-03 | Discharge: 2022-01-03 | Payer: MEDICARE

## 2022-01-03 DIAGNOSIS — T7840XA Allergy, unspecified, initial encounter: Secondary | ICD-10-CM

## 2022-01-03 DIAGNOSIS — E782 Mixed hyperlipidemia: Secondary | ICD-10-CM

## 2022-01-03 DIAGNOSIS — K5900 Constipation, unspecified: Secondary | ICD-10-CM

## 2022-01-03 DIAGNOSIS — M797 Fibromyalgia: Secondary | ICD-10-CM

## 2022-01-03 DIAGNOSIS — R002 Palpitations: Secondary | ICD-10-CM

## 2022-01-03 DIAGNOSIS — K5901 Slow transit constipation: Secondary | ICD-10-CM

## 2022-01-03 LAB — CBC
RBC COUNT: 3.7 M/UL — ABNORMAL LOW (ref 4.0–5.0)
RDW: 13 % (ref 11–15)
WBC COUNT: 8.5 K/UL (ref 4.5–11.0)

## 2022-01-03 LAB — SED RATE: ESR: 16 mm/h (ref 0–30)

## 2022-01-03 LAB — COMPREHENSIVE METABOLIC PANEL
ALK PHOSPHATASE: 93 U/L (ref >40)
CHLORIDE: 107 MMOL/L (ref <1.0)
EGFR: >60 mL/min (ref >60)
GLUCOSE,PANEL: 102 mg/dL — ABNORMAL HIGH (ref 70–100)
POTASSIUM: 3.9 MMOL/L — ABNORMAL LOW (ref 3.5–5.1)
SODIUM: 142 MMOL/L — ABNORMAL LOW (ref 137–147)
TOTAL PROTEIN: 6 g/dL (ref 6.0–8.0)

## 2022-01-03 LAB — LIPID PROFILE
CHOLESTEROL: 188 mg/dL (ref <200)
LDL: 121 mg/dL — ABNORMAL HIGH (ref <100)
TRIGLYCERIDES: 89 mg/dL (ref <150)

## 2022-01-03 LAB — CREATINE KINASE-CPK: CK TOTAL: 27 U/L (ref 21–215)

## 2022-01-03 MED ORDER — CLENPIQ 10 MG-3.5 GRAM- 12 GRAM/160 ML PO SOLN
320 mL | ORAL | 0 refills | Status: AC
Start: 2022-01-03 — End: ?

## 2022-01-03 MED ORDER — CLENPIQ 10 MG-3.5 GRAM- 12 GRAM/160 ML PO SOLN
320 mL | ORAL | 0 refills
Start: 2022-01-03 — End: ?

## 2022-01-04 ENCOUNTER — Encounter: Admit: 2022-01-04 | Discharge: 2022-01-04 | Payer: MEDICARE

## 2022-01-06 ENCOUNTER — Encounter: Admit: 2022-01-06 | Discharge: 2022-01-06 | Payer: MEDICARE

## 2022-01-06 ENCOUNTER — Ambulatory Visit: Admit: 2022-01-06 | Discharge: 2022-01-06 | Payer: MEDICARE

## 2022-01-06 DIAGNOSIS — K5900 Constipation, unspecified: Secondary | ICD-10-CM

## 2022-01-06 NOTE — Telephone Encounter
I sent results to patient via Mychart.     Message  Received: Today  Robin Keens, APRN-NP  P Cvm Nurse Gen Card Team Red  Her cholesterol panel, specifically LDL is suboptimal given her history of coronary disease and aortic valve sclerosis.  She has a history of statin intolerance.  I would like to start her on Repatha injections.  If she is agreeable, please have her repeat a fasting lipid panel in 3 to 4 months after starting injections.  Thanks!  [name]    Lab Results   Component Value Date    CHOL 188 01/03/2022    TRIG 89 01/03/2022    HDL 55 01/03/2022    LDL 121 (H) 01/03/2022    VLDL 18 01/03/2022    NONHDLCHOL 133 01/03/2022

## 2022-01-12 ENCOUNTER — Encounter: Admit: 2022-01-12 | Discharge: 2022-01-12 | Payer: MEDICARE

## 2022-01-13 ENCOUNTER — Encounter: Admit: 2022-01-13 | Discharge: 2022-01-13 | Payer: MEDICARE

## 2022-01-13 DIAGNOSIS — R1084 Generalized abdominal pain: Secondary | ICD-10-CM

## 2022-01-13 DIAGNOSIS — K5900 Constipation, unspecified: Secondary | ICD-10-CM

## 2022-01-13 DIAGNOSIS — R197 Diarrhea, unspecified: Secondary | ICD-10-CM

## 2022-02-02 ENCOUNTER — Ambulatory Visit: Admit: 2022-02-02 | Discharge: 2022-02-02 | Payer: MEDICARE

## 2022-02-02 ENCOUNTER — Encounter: Admit: 2022-02-02 | Discharge: 2022-02-02 | Payer: MEDICARE

## 2022-02-02 DIAGNOSIS — K5900 Constipation, unspecified: Secondary | ICD-10-CM

## 2022-03-02 ENCOUNTER — Encounter: Admit: 2022-03-02 | Discharge: 2022-03-02 | Payer: MEDICARE

## 2022-08-01 ENCOUNTER — Encounter: Admit: 2022-08-01 | Discharge: 2022-08-01 | Payer: MEDICARE

## 2022-08-02 ENCOUNTER — Encounter: Admit: 2022-08-02 | Discharge: 2022-08-02 | Payer: MEDICARE

## 2022-08-02 NOTE — Telephone Encounter
Per Dr. Hale Bogus patient has cardiac clearance for Right total knee arthroplasty.  Dr. Hale Bogus reviewed most recent EKG from [facility].  Patient cleared.

## 2022-11-15 ENCOUNTER — Encounter: Admit: 2022-11-15 | Discharge: 2022-11-15 | Payer: MEDICARE

## 2022-11-16 ENCOUNTER — Encounter: Admit: 2022-11-16 | Discharge: 2022-11-16 | Payer: MEDICARE

## 2022-11-20 ENCOUNTER — Encounter: Admit: 2022-11-20 | Discharge: 2022-11-20 | Payer: MEDICARE

## 2022-11-21 ENCOUNTER — Encounter: Admit: 2022-11-21 | Discharge: 2022-11-21 | Payer: MEDICARE

## 2022-11-22 ENCOUNTER — Encounter: Admit: 2022-11-22 | Discharge: 2022-11-22 | Payer: MEDICARE

## 2022-12-01 ENCOUNTER — Encounter: Admit: 2022-12-01 | Discharge: 2022-12-01 | Payer: MEDICARE

## 2022-12-15 ENCOUNTER — Encounter: Admit: 2022-12-15 | Discharge: 2022-12-15 | Payer: MEDICARE

## 2023-01-01 ENCOUNTER — Encounter: Admit: 2023-01-01 | Discharge: 2023-01-01 | Payer: MEDICARE

## 2023-01-02 ENCOUNTER — Encounter: Admit: 2023-01-02 | Discharge: 2023-01-02 | Payer: MEDICARE

## 2023-01-02 DIAGNOSIS — M549 Dorsalgia, unspecified: Secondary | ICD-10-CM

## 2023-01-03 ENCOUNTER — Encounter: Admit: 2023-01-03 | Discharge: 2023-01-03 | Payer: MEDICARE

## 2023-01-05 ENCOUNTER — Ambulatory Visit: Admit: 2023-01-05 | Discharge: 2023-01-05 | Payer: MEDICARE

## 2023-01-05 ENCOUNTER — Encounter: Admit: 2023-01-05 | Discharge: 2023-01-05 | Payer: MEDICARE

## 2023-01-05 ENCOUNTER — Ambulatory Visit: Admit: 2023-01-05 | Discharge: 2023-01-06 | Payer: MEDICARE

## 2023-01-05 DIAGNOSIS — M48062 Spinal stenosis, lumbar region with neurogenic claudication: Secondary | ICD-10-CM

## 2023-01-05 NOTE — Progress Notes
SPINE CENTER HISTORY AND PHYSICAL    Chief Complaint   Patient presents with    Lower Back - New Patient           Dictation on: 01/05/2023 12:13 PM by: Robin Braun [[email or web address]]    Dictation on: 01/05/2023 12:23 PM by: [name]         Past Medical History:   Diagnosis Date    Asthma Not for a long time!    Since 49 weeks old    Cataract     With end the last 9 yrs!?    Constipation 5 years ago    My colon quit working    Degenerative disc disease, lumbar Have no idea how long !    Thats what they told me from my MRI    Fibromyalgia 1990    i have it in remission    Heart murmur Not sure. It was caused from my stomach issues!    Its on Dr. Hale Bogus reports    Joint pain 2016?  2023    Had both knees replaced.    Kidney stones 4 years    4 years ago  both sides    Nausea 5 years ago    When pain is bad. And had colon surgery  the 4th of November    Palpitation 12/14/2011    Pneumonia 2 or 3 years ago?    2 or 3 years ago    Unspecified deficiency anemia Years ago!    Have had since i had my children.  Up and down! It rans low on the normal side.     Surgical History:   Procedure Laterality Date    ANGIOGRAPHY CORONARY ARTERY WITH LEFT HEART CATHETERIZATION N/A 11/10/2020    Performed by Julienne Kass, MD at [facility] CATH LAB    POSSIBLE PERCUTANEOUS CORONARY STENT PLACEMENT WITH ANGIOPLASTY N/A 11/10/2020    Performed by Julienne Kass, MD at [facility] CATH LAB    ESOPHAGOGASTRODUODENOSCOPY WITH SPECIMEN COLLECTION BY BRUSHING/ WASHING [[id number]] N/A 10/11/2021    Performed by Buckles, Vinnie Level, MD at [facility] ENDO    COLONOSCOPY WITH BIOPSY - FLEXIBLE N/A 10/11/2021    Performed by Buckles, Vinnie Level, MD at [facility] ENDO    ESOPHAGOGASTRODUODENOSCOPY WITH DIRECTED SUBMUCOSAL INJECTION - FLEXIBLE N/A 10/11/2021    Performed by Buckles, Vinnie Level, MD at [facility] ENDO    BLADDER SUSPENSION      CHOLECYSTECTOMY      DOPPLER ECHOCARDIOGRAPHY      HX APPENDECTOMY  In the 50s    Took out when i had gallbladder surgery HX HYSTERECTOMY  In the 70's    HX LYSIS OF ADHESIONS      HYSTERECTOMY      KNEE SURGERY  2024 and sometime after 2016?    PR LAPAROSCOPY SURG RPR INITIAL INGUINAL HERNIA  2020 and one earlier!     Allergies   Allergen Reactions    Iv Contrast Dye [Iodinated Contrast Media] HIVES     Hives following contrast administration during coronary angiography on 11/10/2020    Rosuvastatin MUSCLE PAIN    Celebrex [Celecoxib] ANXIETY    Codeine UNKNOWN    Lyrizine ANXIETY    Morphine UNKNOWN    Vibramycin [Doxycycline Calcium] ANXIETY       Current Outpatient Medications:     cholecalciferol (vitamin D3) (VITAMIN D3) 1,000 units tablet, Take five tablets by mouth daily., Disp: , Rfl:     cyanocobalamin (vitamin B-12) (VITAMIN B12  PO), Take 1 tablet by mouth daily., Disp: , Rfl:     evolocumab (REPATHA SURECLICK) 140 mg/mL injectable PEN, Inject 1 mL under the skin every 14 days., Disp: 2 mL, Rfl: 11    ezetimibe (ZETIA) 10 mg tablet, Take one tablet by mouth daily., Disp: 90 tablet, Rfl: 3    ferrous sulfate (FEOSOL) 325 mg (65 mg iron) tablet, Take one tablet by mouth daily. Take on an empty stomach at least 1 hour before or 2 hours after food., Disp: , Rfl:     ipratropium bromide (ATROVENT) 42 mcg (0.06 %) nasal spray, , Disp: , Rfl:     metoclopramide hcl (REGLAN) 1 mg/mL oral solution, Take 5 mL by mouth before meals and at bedtime., Disp: 473 mL, Rfl: 3    metoprolol XL (TOPROL XL) 25 mg extended release tablet, Take one-half tablet by mouth at bedtime daily., Disp: 90 tablet, Rfl: 1    omeprazole DR (PRILOSEC) 20 mg capsule, Take one capsule by mouth daily before breakfast., Disp: , Rfl:     peg 3350-electrolytes (GOLYTELY) 236-22.74-6.74 -5.86 gram oral solution, At 5:00 PM the day before your procedure, drink one 8-ounce glass of Nulytely/Golytely and repeat every 10-15 minutes until you have finished the first 3/4 gallon of Nulytely/Golytely (3) liters. If you become nauseated, hold off on drinking for 30 minutes or so, then resume. Six hours before your procedure, drink the remaining Nulytely/Golytely 8-ounces at a time. Repeat every 10-15 minutes until you have finished. Refer to the complete directions in My Chart., Disp: 4000 mL, Rfl: 0    peg 3350-electrolytes (GOLYTELY) 236-22.74-6.74 -5.86 gram oral solution, Add water to fill line. Shake until dissolved. May taste better if refrigerated until cold. Drink half of the jug throughout the day. You may still eat during this process. If ineffective, drink the other half of the jug the following day., Disp: 4000 mL, Rfl: 0    progesterone, micronized (PROMETRIUM) 200 mg capsule, Take one capsule by mouth daily., Disp: , Rfl:     rosuvastatin (CRESTOR) 5 mg tablet, one daily or as directed, Disp: 90 tablet, Rfl: 3    sucralfate (CARAFATE) 1 gram tablet, Take one tablet by mouth three times daily., Disp: , Rfl:     zinc sulfate 220 mg (50 mg elemental zinc) capsule, Take one capsule by mouth daily., Disp: , Rfl:   family history includes Cancer in her sister.  Social History     Socioeconomic History    Marital status: Widowed   Tobacco Use    Smoking status: Never    Smokeless tobacco: Never   Vaping Use    Vaping status: Never Used   Substance and Sexual Activity    Alcohol use: Never    Drug use: Never    Sexual activity: Not Currently     Partners: Male     Birth control/protection: None     Review of Systems  Vitals:    01/05/23 1000   BP: 123/54   Pulse: 70   SpO2: 98%   PainSc: Seven   Weight: 53.1 kg (117 lb)   Height: 162.6 cm (5' 4)     Oswestry Total Score:: (Patient-Rptd) 46  Pain Score: Seven  Body mass index is 20.08 kg/m?[name]  Spine PROMIS  Form completed by:: (Patient-Rptd) Self  In general, would you say your health is:: (Patient-Rptd) Good  In general, would you say your quality of life is:: (Patient-Rptd) Fair  In general, how would you rate your  physical health?: (Patient-Rptd) Fair  In general, how would you rate your mental health, including your mood and your ability to think?: (Patient-Rptd) Very Good  In general, how would you rate your satisfaction with your social activities and relationships?: (Patient-Rptd) Fair  In general, please rate how well you carry out your usual social activites and roles. (This includes activities at home, at work and in Paediatric nurse, and responsibilities as a parent, child, spouse, employee, friend, etc.): (Patient-Rptd) Fair  To what extent are you able to carry out everyday physical activities such as walking, climbing stairs, carrying groceries, or moving a chair?: (Patient-Rptd) A Little  In the past 7 days, how often have you been bothered by emotional problems such as feeling anxious, depressed, or irritable?: (Patient-Rptd) Never  In the past 7 days, how would you rate your fatigue on average?: (Patient-Rptd) Mild  In the past 7 days, how would you rate your pain on average?: (Patient-Rptd) 7

## 2023-01-15 ENCOUNTER — Encounter: Admit: 2023-01-15 | Discharge: 2023-01-15 | Payer: MEDICARE

## 2023-01-16 ENCOUNTER — Encounter: Admit: 2023-01-16 | Discharge: 2023-01-16 | Payer: MEDICARE

## 2023-01-16 NOTE — Telephone Encounter
Spoke with patient. Per patient report, this office called her yesterday re: pre-procedure questions. This RN apologized for contacting patient twice. All questions answered to patient satisfaction.

## 2023-01-21 IMAGING — RF FL guided spine inject
1 series · 6 of 6 positions shown · non-contrast
Comparison: none

[Series 1: run · 3 acquisitions, 6 frames shown]
[im 1/3]
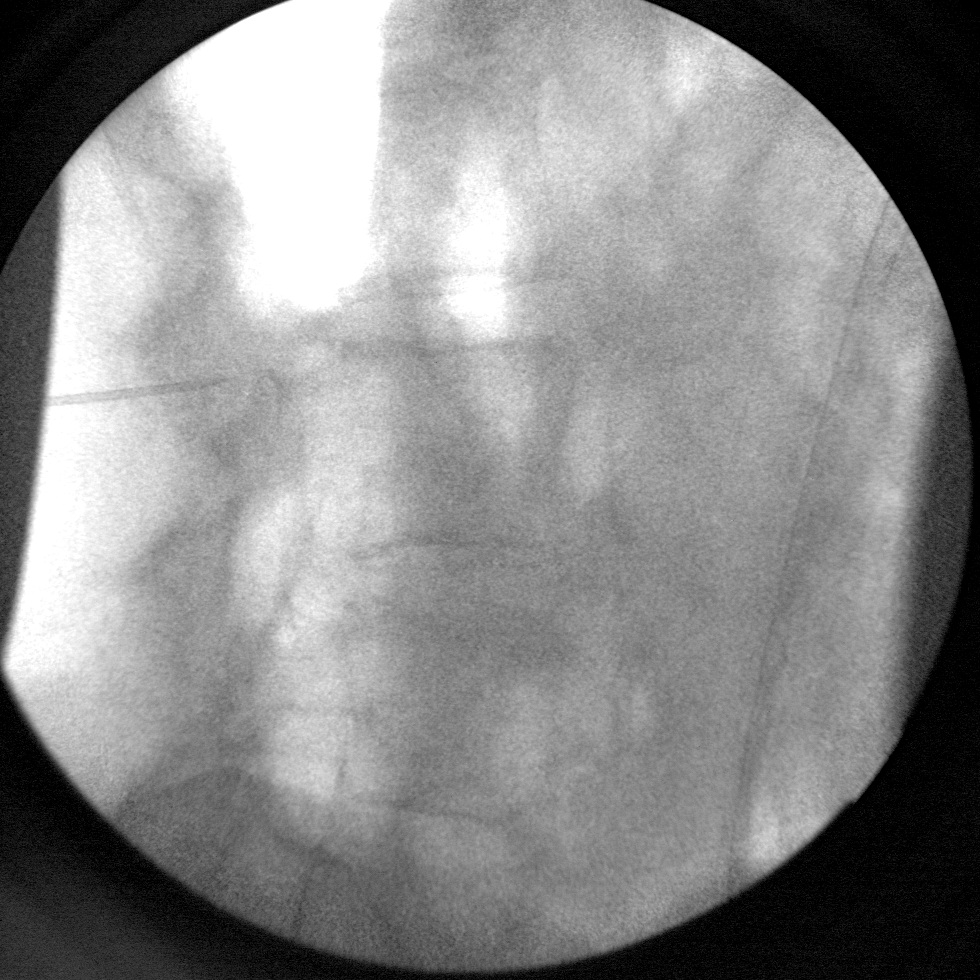
[im 2/3]
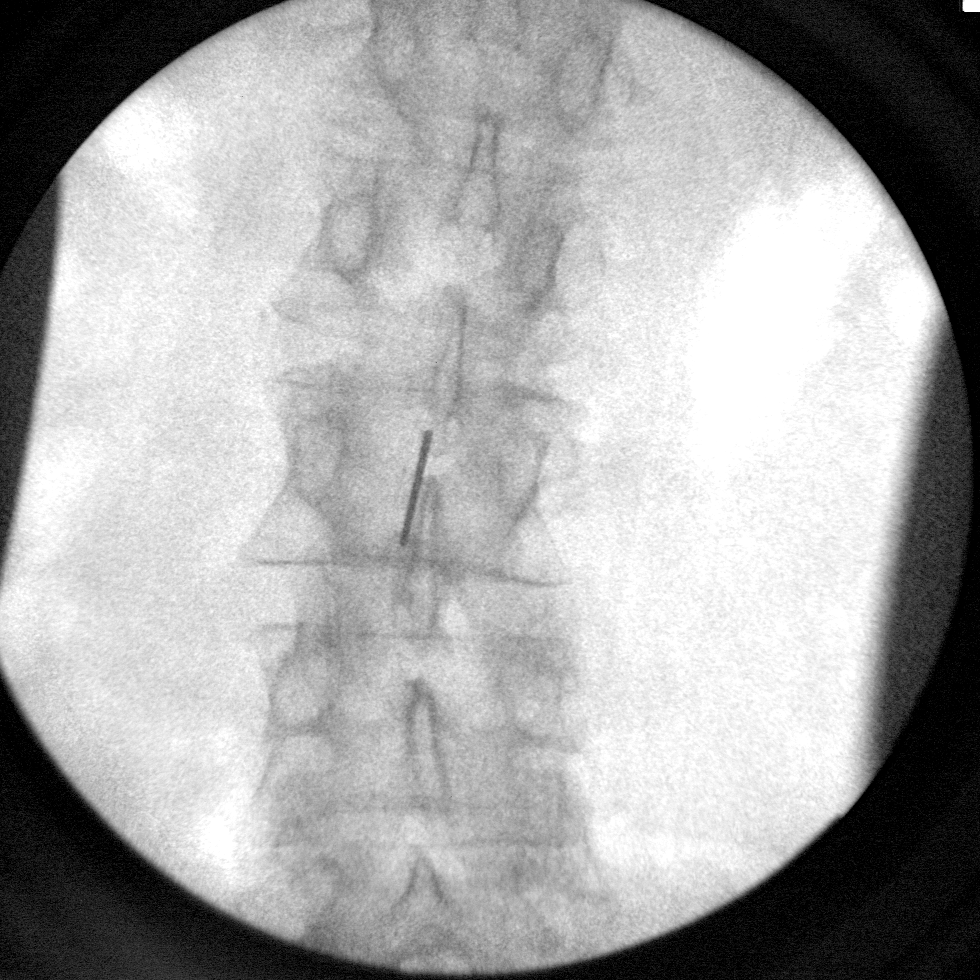
[im 3/3]
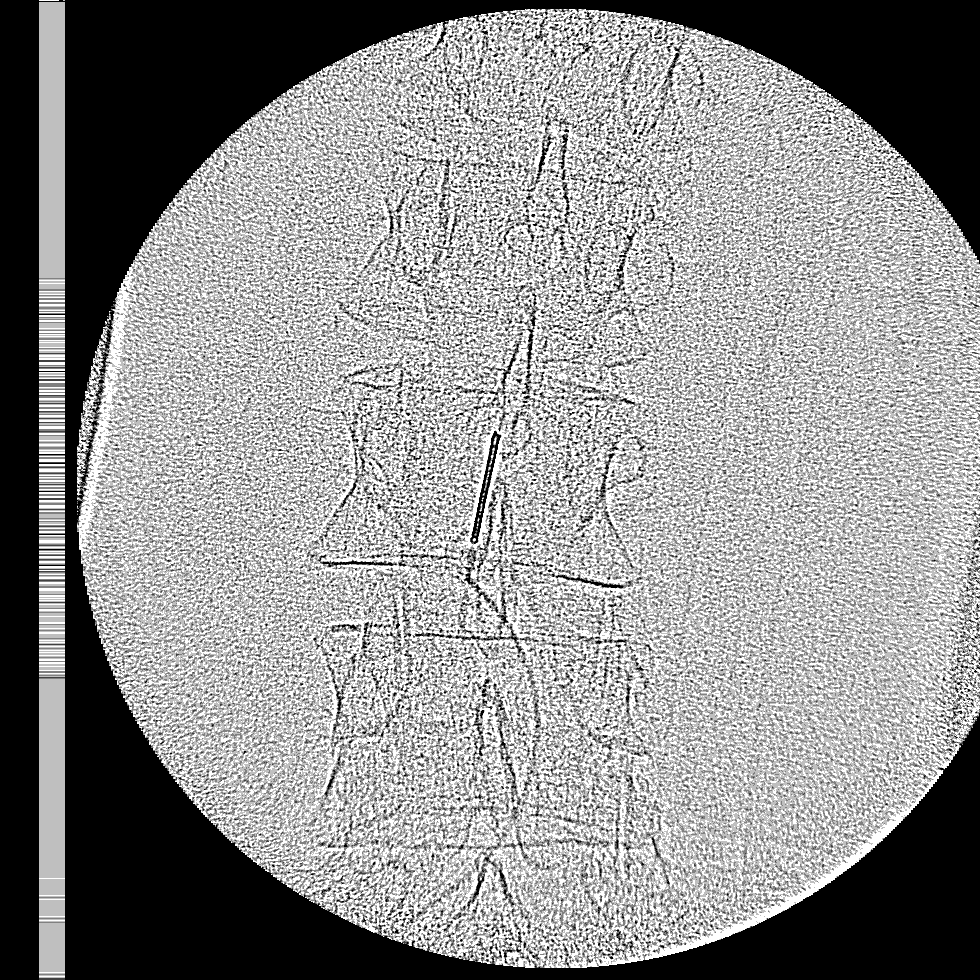
[im 3/3]
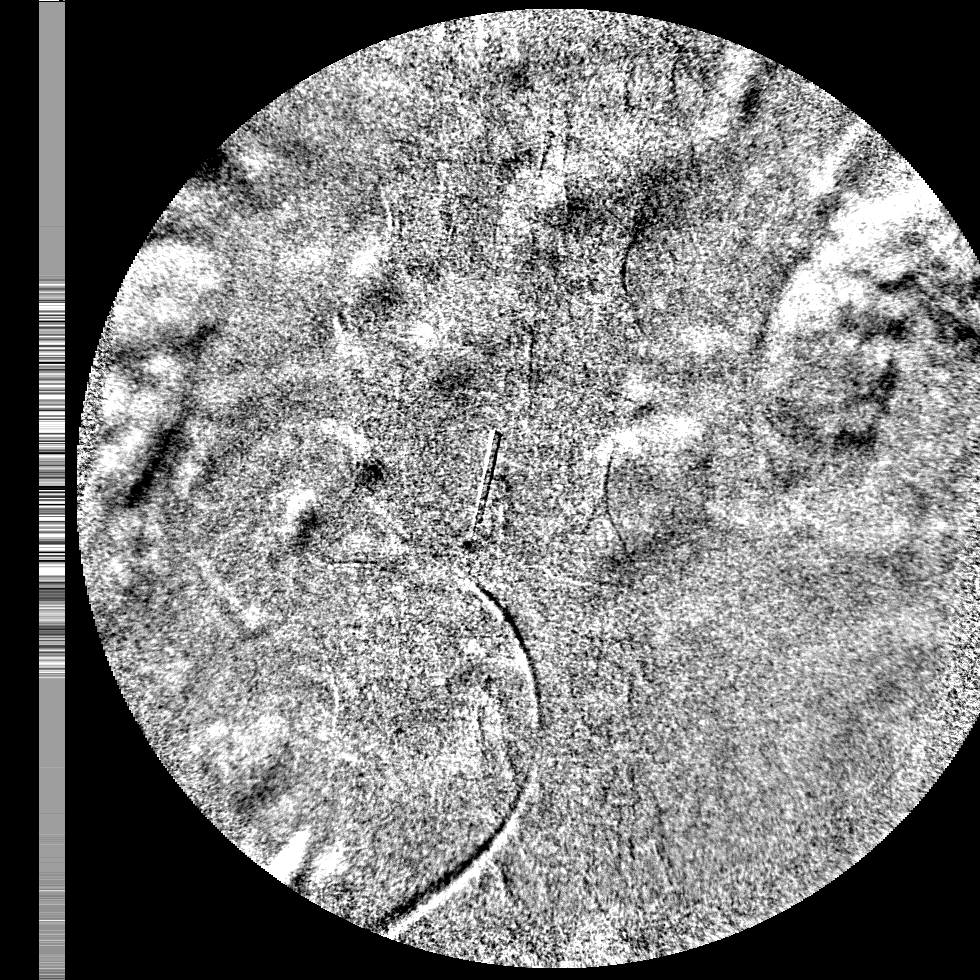
[im 3/3]
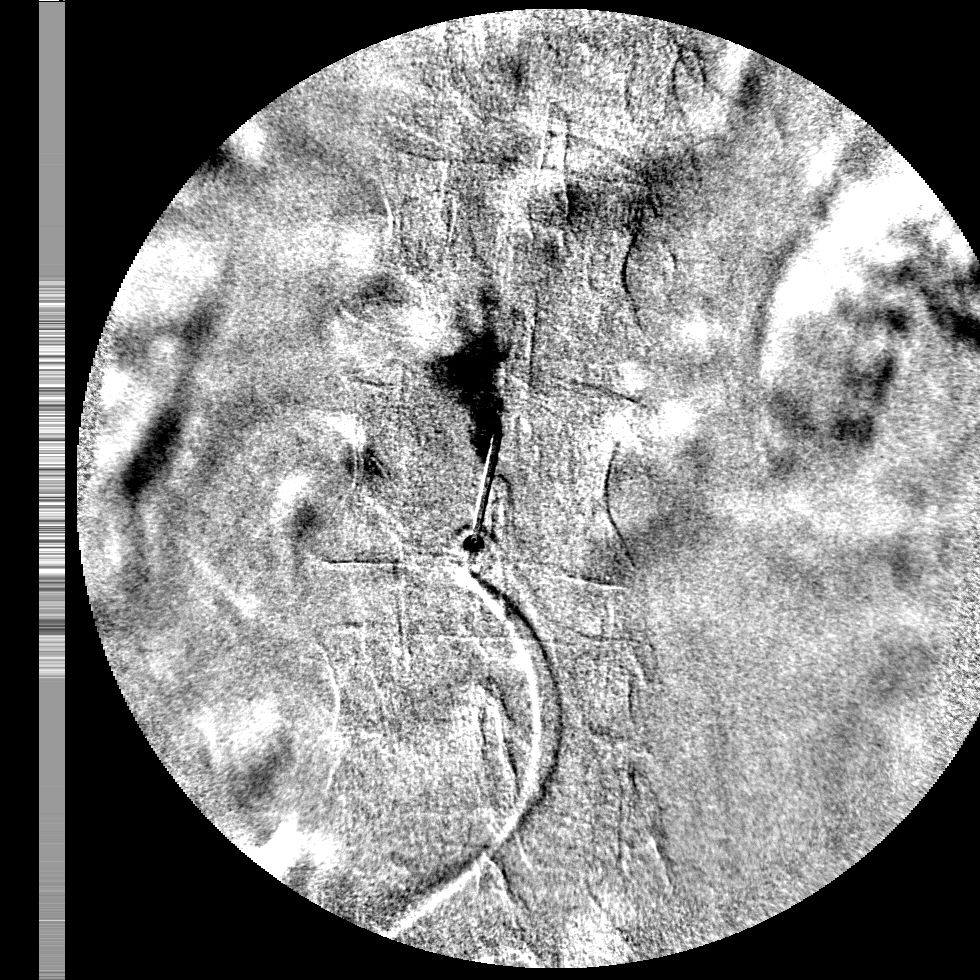
[im 3/3]
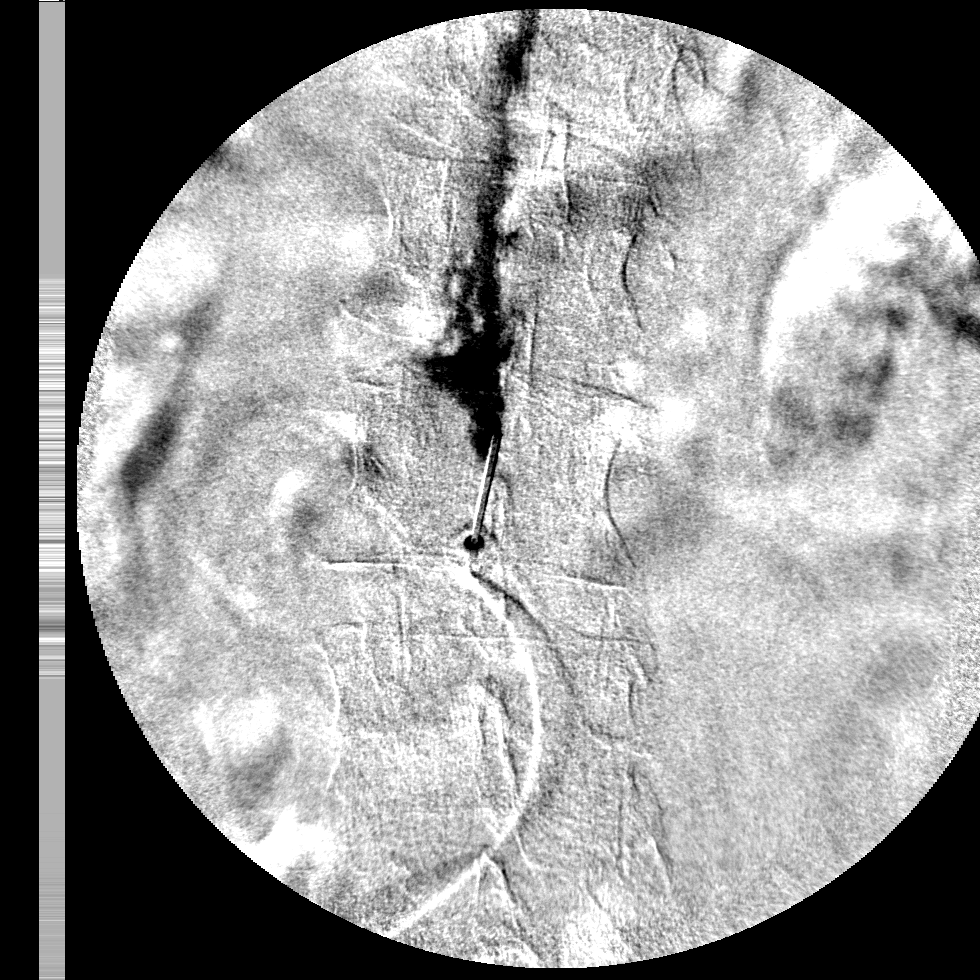

[6 of 6 positions shown; findings below may reference images not displayed]

DIAGNOSTIC STUDIES

EXAM

FL guided spine inject

INDICATION

CHERISSE
CHERISSE. 20 FLUORO IMAGES. R.Rm6y FLUORO DOSE. FLUORO TIME 16.8 SECONDS. TJ

TECHNIQUE

Fluoro time: 16.8 secondsCine run: CZtatic image: 7Cose:

COMPARISONS

None

FINDINGS

Cine run and and intraprocedural images are submitted. For radiopaque needle has its tip midline at
the mid lumbar level; exact level determination is precluded by collimation. Contrast has been
injected.

IMPRESSION

Intraprocedural study as above.

Tech Notes:

CHERISSE. 20 FLUORO IMAGES. R.Rm6y FLUORO DOSE. FLUORO TIME 16.8 SECONDS. TJ

## 2023-01-22 ENCOUNTER — Ambulatory Visit: Admit: 2023-01-22 | Discharge: 2023-01-23 | Payer: MEDICARE

## 2023-01-22 ENCOUNTER — Encounter: Admit: 2023-01-22 | Discharge: 2023-01-22 | Payer: MEDICARE

## 2023-01-23 ENCOUNTER — Encounter: Admit: 2023-01-23 | Discharge: 2023-01-23 | Payer: MEDICARE

## 2023-02-10 ENCOUNTER — Encounter: Admit: 2023-02-10 | Discharge: 2023-02-10 | Payer: MEDICARE

## 2023-02-12 ENCOUNTER — Encounter: Admit: 2023-02-12 | Discharge: 2023-02-12 | Payer: MEDICARE

## 2023-02-13 ENCOUNTER — Encounter: Admit: 2023-02-13 | Discharge: 2023-02-13 | Payer: MEDICARE

## 2023-02-13 ENCOUNTER — Ambulatory Visit: Admit: 2023-02-13 | Discharge: 2023-02-14 | Payer: MEDICARE

## 2023-02-13 ENCOUNTER — Ambulatory Visit: Admit: 2023-02-13 | Discharge: 2023-02-13 | Payer: MEDICARE

## 2023-02-13 DIAGNOSIS — Z136 Encounter for screening for cardiovascular disorders: Secondary | ICD-10-CM

## 2023-02-13 DIAGNOSIS — I2510 Atherosclerotic heart disease of native coronary artery without angina pectoris: Secondary | ICD-10-CM

## 2023-02-13 DIAGNOSIS — R002 Palpitations: Secondary | ICD-10-CM

## 2023-02-13 DIAGNOSIS — I493 Ventricular premature depolarization: Secondary | ICD-10-CM

## 2023-02-13 DIAGNOSIS — E7800 Pure hypercholesterolemia, unspecified: Secondary | ICD-10-CM

## 2023-02-13 NOTE — Patient Instructions
Echo   Monitor  Follow up 6 months    Follow up as directed.  Call sooner if issues.  Call the [facility] nursing line at [phone].  Leave a detailed message for the nurse in [place] with how we can assist you and we will call you back.

## 2023-02-13 NOTE — Progress Notes
To our valued patient,     We have enrolled your heart monitor and requested it be sent to your home.  You should receive this within 2-3 business days. Please wear the monitor for 7 days. When you have completed the study, please remove the device, and mail it back to the company. Please call [employer] Customer Service at [phone] with questions about placement, troubleshooting, and insurance coverage. You can reach the [facility] ambulatory heart monitor team at [phone].      Your Heart Rhythm Management Team  Cardiovascular Medicine Department at [facility]              Ambulatory (External) Cardiac Monitor Enrollment Record     Placement Location: Home Enrollment  Vendor: [employer] ([employer])  Mobile Cardiac Telemetry (MCOT/MCT)?: No  Duration of Monitor (in days): 7  Monitor Diagnosis: Palpitations (R00.2)  No data recordedOrdering Provider: Hester Mates, MD  AMB Monitor Serial Number: Home Enrollment  No data recorded    Start Time and Date: 02/13/23 5:40 PM   Patient Name: Robin Braun  DOB: [birth year]  MRN: [id number]  Sex: female  Mobile Phone Number: [phone] (mobile)  Home Phone Number: [phone]  Patient Address: [address] [place] [address]  Insurance Coverage: [employer] MEDICARE PPO  Insurance ID: [id number]  Insurance Group #: [id number]  Insurance Subscriber: Koren Bound  Implanted Cardiac Device Information: No results found for: [email or web address]      Patient instructed to contact company phone number on the monitor box with questions regarding billing, placement, troubleshooting.     Stormie Ventola    ____________________________________________________________    Clinic Staff:    Complete additional steps for documentation double check/Co-Sign.  In Follow-up, send chart upon closing encounter to P C[facility] HRM AMBULATORY MONITORS    HRM Ambulatory Monitoring Team:  Schedule on appropriate template and check-in.   Clinic Placement Schedule on clinic location [facility] schedule   Home Enrollment Schedule on Home Enrollment schedule ([facility])   Given to patient in clinic for self-placement Schedule on Home Enrollment schedule ([facility] RHYTHM)   Inpatient Schedule on [place] C[facility] AMBULATORY MONITORING template   2. Please enroll with appropriate vendor.

## 2023-02-13 NOTE — Progress Notes
Date of Service: 02/13/2023    Robin Braun is a 83 y.o. female.       HPI     Robin Braun presents with palpitations.  The palpitations are chronic but have been worse over the past 2 weeks.  They are not associated with lightheadedness, presyncope or syncope.  She also reports mild chronic orthostasis.  Robin Braun indicates that she had lost close to 60 pounds over the past year and reports that she was hospitalized at [facility] for further evaluation of weight loss and chronic abdominal discomfort.  On December 04, 2022 she underwent laparoscopic descending colon/proximal sigmoid colon end colostomy for the treatment of delayed colonic transit and fecal incontinence.  She reports having a very good result.  On August 07, 2022 she reports undergoing total right knee replacement again with excellent results.  Otherwise, the patient reports no angina, congestive symptoms, falls, presyncope or syncope.  She reports that her exercise tolerance has been stable.  She walks a lot for exercise but does not have a regular exercise routine.  She routinely travels to [place] for the winter time.  The patient reports no myalgias, bleeding abnormalities, or strokelike symptoms.       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 [facility]      Functional systolic murmur 09/07/2015     noted on exam 08/07/15  03/26/20 echo [facility] heart clinic after syncope shows normal aortic valve no sclerosis no stenosis.  EF 60 to 65% no valve disease.  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 She has a IgG level of 158, overseen by Robin Braun,       Bronchiectasis without complication (HCC) 02/10/2014     bronchiectasis  Noted by Robin Braun.       Hepatic steatosis 02/10/2014     2015: CT abdomen: mild fatty infiltration of the liver, mild intrahepatic ductal dilatation.  cholecystectomy. There was mild extrahepatic ductal dilatation, common bile duct was 5.6 mm and on prior CT of 4.4 mm. Suggestion of some mild wall thickening of the distal common duct. There were no hepatic filling defects seen and no adenopathy was seen.Impressions:   fatty infiltration of the liver, post cholecystectomy with ectasia of the bile duct, and mild thickening of the wall of the common duct. In the presence of mildly elevated liver enzymes without elevated bilirubin a biopsy should be considered. No focal solid organ abnormality, a small hiatal hernia, mild left colon diverticulosis, post hysterectomy changes of the bladder.        Palpitation 12/14/2011  2007 palpitations, resolved spontanously  11/13 recurrent palpitations, Eval MAC Robin Braun:K+=4.7, EchoDop: EF 60%, normal LA, No TR, PAP not measurable, Valves OK no effusion.  32 day  event monitor: 5episodes of pounding heartbeats and fluttering palpitations associated w/ RBBB single  PVCs.  06/27/13 8 1/2 minute Bruce Exercise Echo EF 55%, no ischemia. Valves OK.  HR>100% target, rare PVC, stopped w/ fatigue, no ST changes or arrhythmias.            Review of Systems   Constitutional: Negative.   HENT: Negative.     Eyes: Negative.    Cardiovascular:  Positive for palpitations.   Respiratory: Negative.     Endocrine: Negative.    Hematologic/Lymphatic: Negative. Skin: Negative.    Musculoskeletal: Negative.    Gastrointestinal: Negative.    Genitourinary: Negative.    Neurological: Negative.    Psychiatric/Behavioral: Negative.     Allergic/Immunologic: Negative.        Physical Exam  GENERAL: The patient is well developed, well nourished, resting comfortably and in no distress.   HEENT: No abnormalities of the visible oro-nasopharynx, conjunctiva or sclera are noted.  NECK: There is no jugular venous distension. Carotids are palpable and without bruits. There is no thyroid enlargement.  Chest: Lung fields are clear to auscultation. There are no wheezes or crackles.  CV: There is a regular rhythm. The first and second heart sounds are normal. There are no murmurs, gallops or rubs.  ABD: The abdomen is soft and supple with normal bowel sounds. There is no hepatosplenomegaly, ascites, tenderness, masses or bruits.  Neuro: There are no focal motor defects. Ambulation is normal. Cognitive function appears normal.  Ext: There is no edema or evidence of deep vein thrombosis. Peripheral pulses are satisfactory.    SKIN: There are no rashes and no cellulitis  PSYCH: The patient is calm, rationale and oriented.    Cardiovascular Studies  A twelve-lead ECG was obtained on 02/13/2023 reveals normal sinus rhythm with a heart rate of 84 bpm.  Left anterior fascicular block is noted.    Cardiovascular Health Factors  Vitals BP Readings from Last 3 Encounters:   02/13/23 116/69   01/22/23 126/67   01/05/23 123/54     Wt Readings from Last 3 Encounters:   02/13/23 51.8 kg (114 lb 3.2 oz)   01/22/23 53.1 kg (117 lb)   01/05/23 53.1 kg (117 lb)     BMI Readings from Last 3 Encounters:   02/13/23 19.60 kg/m?   01/22/23 20.08 kg/m?   01/05/23 20.08 kg/m?      Smoking Social History     Tobacco Use   Smoking Status Never   Smokeless Tobacco Never      Lipid Profile Cholesterol   Date Value Ref Range Status   01/03/2022 188 <200 MG/DL Final     HDL   Date Value Ref Range Status   01/03/2022 55 >40 MG/DL Final     LDL   Date Value Ref Range Status   01/03/2022 121 (H) <100 mg/dL Final     Triglycerides   Date Value Ref Range Status   01/03/2022 89 <150 MG/DL Final      Blood Sugar No results found for: HGBA1C  Glucose   Date Value Ref Range Status   01/03/2022 102 (H) 70 - 100 MG/DL Final   13/08/6576 469 70 - 100 MG/DL Final   62/95/2841 99 70 - 100 MG/DL Final          Problems Addressed  Today  Encounter Diagnoses   Name Primary?    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 Broz reports chronic palpitations that have worsened over the past week or two.  I have asked her to obtain a 7-day event monitor.  I have also asked her to obtain an echo Doppler study to assess for any structural cardiac abnormalities.  Cardiovascular risk factor modification was reviewed in detail.  I have asked her to return for follow-up in 6 months time to follow her progress. The total time spent during this interview and exam with preparation and chart review was 60 minutes.         Current Medications (including today's revisions)   ascorbic acid (vitamin C) 1,000 mg tablet Take one tablet by mouth daily.    CHOLEcalciferoL (vitamin D3) (OPTIMAL D3) 50,000 units capsule Take one capsule by mouth every 7 days.    CREON 36,000-114,000- 180,000 unit capsule Take one capsule by mouth daily.    cyanocobalamin (vitamin B-12) (VITAMIN B12 PO) Take 1 tablet by mouth as Needed.    ferrous sulfate (FEOSOL) 325 mg (65 mg iron) tablet Take one tablet by mouth as Needed. Take on an empty stomach at least 1 hour before or 2 hours after food.    lidocaine (LIDODERM) 5 % topical patch Apply one patch topically to affected area every 24 hours.    LINZESS 145 mcg capsule Take one capsule by mouth daily.    progesterone, micronized (PROMETRIUM) 200 mg capsule Take one capsule by mouth daily.

## 2023-02-16 ENCOUNTER — Encounter: Admit: 2023-02-16 | Discharge: 2023-02-16 | Payer: MEDICARE

## 2023-02-16 ENCOUNTER — Ambulatory Visit: Admit: 2023-02-16 | Discharge: 2023-02-17 | Payer: MEDICARE

## 2023-02-21 ENCOUNTER — Encounter: Admit: 2023-02-21 | Discharge: 2023-02-21 | Payer: MEDICARE

## 2023-02-21 NOTE — Telephone Encounter
I called pt. and went over results of recent echo as well as Dr. Gwinda Passe interpretation. Pt. verbalized understanding and denied any other questions at this time.

## 2023-02-21 NOTE — Telephone Encounter
Hester Mates, MD  Willene Hatchet, RN  Cc: Agapito Games, RN  Favorable echo Doppler study.  Please let her know.  Thanks.  SBG

## 2023-02-21 NOTE — Progress Notes
Ambulatory (External) Cardiac Monitor Enrollment Record     Placement Location: Clinic Placement  Clinic Location: [facility]  Vendor: [employer] ([employer])  Mobile Cardiac Telemetry (MCOT/MCT)?: No  Duration of Monitor (in days): 7  Monitor Diagnosis: Palpitations (R00.2)  No data recordedOrdering Provider: Dedra Skeens  AMB Monitor Serial Number: [id number]  No data recorded    Start Time and Date: 02/21/23 3:25 PM   Patient Name: Robin Braun  DOB: 01-13-43 1940/06/25  MRN: [id number]  Sex: female  Mobile Phone Number: [phone] (mobile)  Home Phone Number: [phone]  Patient Address: [address] [place] [address]  Insurance Coverage: [employer] MEDICARE PPO  Insurance ID: [id number]  Insurance Group #: [id number]  Insurance Subscriber: Koren Bound  Implanted Cardiac Device Information: No results found for: EPDEVTYP      Patient instructed to contact company phone number on the monitor box with questions regarding billing, placement, troubleshooting.     Dahlia Client Thomas Rhude    ____________________________________________________________    Clinic Staff:    Complete additional steps for documentation double check/Co-Sign.  In Follow-up, send chart upon closing encounter to P [facility] AMBULATORY MONITORS    HRM Ambulatory Monitoring Team:  Schedule on appropriate template and check-in.   Clinic Placement Schedule on clinic location [facility] schedule   Home Enrollment Schedule on Home Enrollment schedule ([facility])   Given to patient in clinic for self-placement Schedule on Home Enrollment schedule ([facility])   Inpatient Schedule on [place] [facility] AMBULATORY MONITORING template   2. Please enroll with appropriate vendor.

## 2023-03-07 ENCOUNTER — Encounter: Admit: 2023-03-07 | Discharge: 2023-03-07 | Payer: MEDICARE

## 2023-03-07 NOTE — Telephone Encounter
-----   Message from Wesley Blas, MD sent at 03/07/2023 10:37 AM CST -----  Favorable event monitor.  No serious arrhythmias noted.  1 diary entry correlated with a single premature supraventricular ectopic beat.  Please let her know.  Thanks.  SBG

## 2023-03-07 NOTE — Telephone Encounter
Results and recommendations called to patient.

## 2023-03-19 ENCOUNTER — Encounter: Admit: 2023-03-19 | Discharge: 2023-03-19 | Payer: MEDICARE

## 2023-06-28 ENCOUNTER — Encounter: Admit: 2023-06-28 | Discharge: 2023-06-28 | Payer: MEDICARE

## 2023-07-03 ENCOUNTER — Encounter: Admit: 2023-07-03 | Discharge: 2023-07-03 | Payer: MEDICARE

## 2023-07-05 ENCOUNTER — Encounter: Admit: 2023-07-05 | Discharge: 2023-07-05 | Payer: MEDICARE

## 2023-07-05 ENCOUNTER — Ambulatory Visit: Admit: 2023-07-05 | Discharge: 2023-07-06 | Payer: MEDICARE

## 2023-07-05 MED ORDER — HYDROCODONE-ACETAMINOPHEN 5-325 MG PO TAB
1 | ORAL_TABLET | Freq: Every day | ORAL | 0 refills | 30.00000 days | Status: AC | PRN
Start: 2023-07-05 — End: ?

## 2023-07-05 NOTE — Progress Notes
 [facility] CLINIC NOTE       SUBJECTIVE: Robin Braun presents for care regarding back and lower extremity pain.  Pain is bilateral and intermittently sharp and dull.  Lumbar transforaminal epidural steroid injection performed December was not of much benefit.  Her pain is bilateral in the lumbosacral region and typically not radiating.  For pain relief she is utilizing ibuprofen and a Lidoderm patch but would prefer to have a stronger medication for severe exacerbations.         Review of Systems    Current Outpatient Medications:     ascorbic acid (vitamin C) 1,000 mg tablet, Take one tablet by mouth daily., Disp: , Rfl:     CHOLEcalciferoL (vitamin D3) (OPTIMAL D3) 50,000 units capsule, Take one capsule by mouth every 7 days., Disp: , Rfl:     CREON 36,000-114,000- 180,000 unit capsule, Take one capsule by mouth daily., Disp: , Rfl:     cyanocobalamin (vitamin B-12) (VITAMIN B12 PO), Take 1 tablet by mouth as Needed., Disp: , Rfl:     ferrous sulfate (FEOSOL) 325 mg (65 mg iron) tablet, Take one tablet by mouth as Needed. Take on an empty stomach at least 1 hour before or 2 hours after food., Disp: , Rfl:     HYDROcodone/acetaminophen (NORCO) 5/325 mg tablet, Take one tablet by mouth daily as needed for Pain., Disp: 30 tablet, Rfl: 0    lidocaine (LIDODERM) 5 % topical patch, Apply one patch topically to affected area every 24 hours., Disp: , Rfl:     LINZESS 145 mcg capsule, Take one capsule by mouth daily., Disp: , Rfl:     progesterone, micronized (PROMETRIUM) 200 mg capsule, Take one capsule by mouth daily., Disp: , Rfl:   Allergies   Allergen Reactions    Iv Contrast Dye [Iodinated Contrast Media] HIVES     Hives following contrast administration during coronary angiography on 11/10/2020    Rosuvastatin MUSCLE PAIN    Celebrex [Celecoxib] ANXIETY    Codeine UNKNOWN    Lyrizine ANXIETY    Vibramycin [Doxycycline Calcium] ANXIETY     Physical Exam  Vitals:    07/05/23 1015   BP: 120/51   Pulse: 89   SpO2: 99%   PainSc: Seven   Weight: 51.7 kg (114 lb)   Height: 162.6 cm (5' 4)     Oswestry Total Score:: (Patient-Rptd) 38  Pain Score: Seven  Body mass index is 19.57 kg/m?Robin Braun  General: Alert and oriented, very pleasant female.   HEENT showed extraocular muscles were intact and no other abnormalities.  Unlabored breathing.  Regular rate and rhythm on CV exam.   5/5 strength in bilateral upper and lower extremities.    Sensation is intact to light touch and equal in the upper and lower extremities.   There is bilateral lumbar tenderness to palpation         IMPRESSION:  1. Lumbar radiculopathy          PLAN: Will schedule a lumbar epidural steroid injection under fluoroscopy at L4-5.  In the meantime I am going to provide hydrocodone 5-325 mg 1 tablet/day as needed #30.

## 2023-07-06 DIAGNOSIS — M5416 Radiculopathy, lumbar region: Secondary | ICD-10-CM

## 2023-07-18 ENCOUNTER — Encounter: Admit: 2023-07-18 | Discharge: 2023-07-18 | Payer: MEDICARE

## 2023-07-25 ENCOUNTER — Ambulatory Visit: Admit: 2023-07-25 | Discharge: 2023-07-25 | Payer: MEDICARE

## 2023-07-25 ENCOUNTER — Encounter: Admit: 2023-07-25 | Discharge: 2023-07-25 | Payer: MEDICARE

## 2023-07-25 ENCOUNTER — Ambulatory Visit: Admit: 2023-07-25 | Discharge: 2023-07-26 | Payer: MEDICARE

## 2023-08-07 ENCOUNTER — Encounter: Admit: 2023-08-07 | Discharge: 2023-08-07 | Payer: MEDICARE

## 2023-08-27 ENCOUNTER — Encounter: Admit: 2023-08-27 | Discharge: 2023-08-27 | Payer: MEDICARE

## 2023-08-28 ENCOUNTER — Encounter: Admit: 2023-08-28 | Discharge: 2023-08-28 | Payer: MEDICARE

## 2023-08-28 ENCOUNTER — Ambulatory Visit: Admit: 2023-08-28 | Discharge: 2023-08-28 | Payer: MEDICARE

## 2023-08-28 DIAGNOSIS — Z136 Encounter for screening for cardiovascular disorders: Principal | ICD-10-CM

## 2023-08-28 DIAGNOSIS — E7800 Pure hypercholesterolemia, unspecified: Secondary | ICD-10-CM

## 2023-08-28 DIAGNOSIS — R002 Palpitations: Secondary | ICD-10-CM

## 2023-08-28 DIAGNOSIS — I2510 Atherosclerotic heart disease of native coronary artery without angina pectoris: Secondary | ICD-10-CM

## 2023-08-28 DIAGNOSIS — I493 Ventricular premature depolarization: Secondary | ICD-10-CM

## 2023-08-28 MED ORDER — METOPROLOL SUCCINATE 25 MG PO TB24
25 mg | ORAL_TABLET | Freq: Every day | ORAL | 1 refills | 90.00000 days | Status: DC
Start: 2023-08-28 — End: 2023-08-28

## 2023-08-28 MED ORDER — METOPROLOL SUCCINATE 25 MG PO TB24
12.5 mg | ORAL_TABLET | Freq: Every day | ORAL | 1 refills | 90.00000 days | Status: AC
Start: 2023-08-28 — End: ?

## 2023-08-28 NOTE — Patient Instructions
 Thank you for visiting our office today.    We would like to make the following medication adjustments:      ToprolXL 12.5mg  daily       Otherwise continue the same medications as you have been doing.          We will be pursuing the following tests after your appointment today:       Orders Placed This Encounter    ECG 12-LEAD    metoprolol  succinate XL (TOPROL  XL) 25 mg extended release tablet         We will plan to see you back in 6 months.  Please call us  in the meantime with any questions or concerns.        Please allow 5-7 business days for our providers to review your results. All normal results will go to MyChart. If you do not have Mychart, it is strongly recommended to get this so you can easily view all your results. If you do not have mychart, we will attempt to call you once with normal lab and testing results. If we cannot reach you by phone with normal results, we will send you a letter.  If you have not heard the results of your testing after one week please give us  a call.       Your Cardiovascular Medicine [place] Team [place], [place], [place], and [place])  phone number is [phone].

## 2023-08-28 NOTE — Progress Notes
 Date of Service: 08/28/2023    Robin Braun is a 83 y.o. female.       HPI   Robin Braun is followed for palpitations.  She has flares of palpitations followed by quiescent periods.  They are not associated with lightheadedness, presyncope or syncope.  She believes that her palpitations increase when she has abdominal discomfort, gas and frequent bowel movements. Otherwise, the patient reports no angina, congestive symptoms, falls, presyncope or syncope.  She reports that her exercise tolerance has been stable.  She walks a lot for exercise but does not have a regular exercise routine.  [place] the [place] time.  The patient reports no myalgias, bleeding abnormalities, or strokelike symptoms.   Historically, [name] indicates that she was previously hospitalized at [facility] for evaluation of weight loss and chronic abdominal discomfort.  On December 04, 2022 she underwent laparoscopic descending colon/proximal sigmoid colon surgery for the treatment of delayed colonic transit and fecal incontinence.  She reports having a very good result.  On August 07, 2022 she reports undergoing total right knee replacement again with excellent results.       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 Sharolyn Lowers, Dr Delon Braun surgeons. [facility]      Functional systolic murmur 09/07/2015 noted on exam 08/07/15  03/26/20 echo [facility] after syncope shows normal aortic valve no sclerosis no stenosis.  EF 60 to 65% no valve disease.  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 She has a IgG level of 158, overseen by Robin Braun,       Bronchiectasis without complication (CMS-HCC) 02/10/2014     bronchiectasis  Noted by Robin Braun.       Hepatic steatosis 02/10/2014     2015: CT abdomen: mild fatty infiltration of the liver, mild intrahepatic ductal dilatation.  cholecystectomy. There was mild extrahepatic ductal dilatation, common bile duct was 5.6 mm and on prior CT of 4.4 mm. Suggestion of some mild wall thickening of the distal common duct. There were no hepatic filling defects seen and no adenopathy was seen.Impressions:   fatty infiltration of the liver, post cholecystectomy with ectasia of the bile duct, and mild thickening of the wall of the common duct. In the presence of mildly elevated liver enzymes without elevated bilirubin a biopsy should be considered. No focal solid organ abnormality, a small hiatal hernia, mild left colon diverticulosis, post hysterectomy changes of the bladder.        Palpitation 12/14/2011     2007 palpitations, resolved spontanously  11/13 recurrent palpitations,  Eval MAC Robin Braun:K+=4.7, EchoDop: EF 60%, normal LA, No TR, PAP not measurable, Valves OK no effusion.  32 day  event monitor: 5episodes of pounding heartbeats and fluttering palpitations associated w/ RBBB single  PVCs.  06/27/13 8 1/2 minute Bruce Exercise Echo EF 55%, no ischemia. Valves OK.  HR>100% target, rare PVC, stopped w/ fatigue, no ST changes or arrhythmias.            Review of Systems   Constitutional: Negative.   HENT: Negative.     Eyes: Negative.    Cardiovascular:  Positive for dyspnea on exertion, irregular heartbeat and palpitations.   Respiratory: Negative. Endocrine: Negative.    Hematologic/Lymphatic: Negative.    Skin: Negative.    Musculoskeletal: Negative.    Gastrointestinal: Negative.    Genitourinary: Negative.    Neurological: Negative.    Psychiatric/Behavioral: Negative.     Allergic/Immunologic: Negative.      Physical Exam  GENERAL: The patient is well developed, well nourished, resting comfortably and in no distress.   HEENT: No abnormalities of the visible oro-nasopharynx, conjunctiva or sclera are noted.  NECK: There is no jugular venous distension. Carotids are palpable and without bruits. There is no thyroid enlargement.  Chest: Lung fields are clear to auscultation. There are no wheezes or crackles.  CV: There is a regular rhythm. The first and second heart sounds are normal. There are no murmurs, gallops or rubs.  ABD: The abdomen is soft and supple with normal bowel sounds. There is no hepatosplenomegaly, ascites, tenderness, masses or bruits.  Neuro: There are no focal motor defects. Ambulation is normal. Cognitive function appears normal.  Ext: There is no edema or evidence of deep vein thrombosis. Peripheral pulses are satisfactory.    SKIN: There are no rashes and no cellulitis  PSYCH: The patient is calm, rationale and oriented.    Cardiovascular Studies  A twelve-lead ECG obtained on 08/28/2023 reveals normal sinus rhythm with a heart rate of 62 bpm.  Left axis deviation is noted.  Event monitor 2/42025:  Interpretation Summary    An event monitor was obtained to correlate symptoms with any significant ectopy or arrhythmias.  The duration of the event monitor was 7 days and 7 hours with 7 days and 6 hours of analysis time.  The underlying rhythm was normal sinus rhythm with an average heart rate of 78 bpm.  The heart rate range was 54-140 bpm.  Infrequent isolated premature supraventricular ectopic beats were seen with a frequency less than 1.0%.  There were rare paired supraventricular ectopic beats and no supraventricular ectopic triplets.  There were 3 short episodes of nonsustained supraventricular tachycardia.  A 5 beat episode of nonsustained supraventricular tachycardia was noted at 5:14 AM on 02/22/2023 with an average heart rate of 126 bpm.  The longest episode of nonsustained supraventricular tachycardia occurred at 6:53 PM on 02/21/2023 and lasted 11 beats with an average heart rate of 113 bpm.  No sustained supraventricular tachycardia was noted.  Infrequent isolated ventricular ectopy was seen without paired ventricular ectopy or ventricular ectopic triplets.  No ventricular tachycardia was noted, either nonsustained or sustained.  No episodes of ventricular bigeminy or ventricular trigeminy were seen.  There were no significant pauses lasting greater than 3.0 seconds and no episodes of second or third-degree AV block were noted.  No episodes of atrial fibrillation/flutter were recorded.  There were 4 triggered events and 7 diary entries.  Symptoms of lightheadedness while walking reported 8:04 PM on 02/21/2023 correlated with normal  sinus rhythm with a heart rate of 91 bpm without ectopy or arrhythmias.  Symptoms of lightheadedness reported at 9 AM on 02/22/2023 correlated with normal sinus rhythm with a heart rate of 82 bpm without significant ectopy or arrhythmias.  Symptoms of lightheadedness and shortness of breath reported at 7:55 AM on 02/25/2023 correlated with mild sinus tachycardia with a heart rate of 101 bpm and a single isolated supraventricular ectopic beat.  Symptoms of chest pain or pressure, irregular beats reported at 4:15 AM on 02/26/2023 correlated with normal sinus rhythm and a heart rate of 87 bpm without significant ectopy or arrhythmias.  Symptoms of lightheadedness reported at 12:20 PM on 02/28/2023 correlated with normal sinus rhythm with a heart rate of 74 bpm without significant ectopy or arrhythmias.  1 diary entry did not specify time or date and 1 diary entry occurred outside of the wear period.    Echo Doppler 02/16/2023:  Interpretation Summary  No regional wall motion abnormalities are seen. Overall left ventricular systolic function appears normal and dynamic. The estimated left ventricular ejection fraction is 65-70%.  Mild concentric left ventricular hypertrophy.    Normal left ventricular diastolic function.   Right ventricular chamber dimensions and contractility appear normal.  Normal atrial chamber dimensions.  Mild mitral annular calcification is seen.  Otherwise, the aortic, mitral and tricuspid cardiac valves appear structurally normal. There is no evidence of significant valvular regurgitation or stenosis by doppler exam.  The aortic root and the visualized portions of the ascending aorta appear normal in size.  No pericardial effusion is seen.  The estimated peak systolic PA pressure = 28 mmHg.  There has been no significant change when compared to the prior exam performed on 06/27/13.    Cardiovascular Health Factors  Vitals BP Readings from Last 3 Encounters:   08/28/23 131/67   07/25/23 134/63   07/05/23 120/51     Wt Readings from Last 3 Encounters:   08/28/23 55.2 kg (121 lb 12.8 oz)   07/25/23 53.5 kg (118 lb)   07/05/23 51.7 kg (114 lb)     BMI Readings from Last 3 Encounters:   08/28/23 20.91 kg/m?   07/25/23 20.25 kg/m?   07/05/23 19.57 kg/m?      Smoking Social History     Tobacco Use   Smoking Status Never   Smokeless Tobacco Never      Lipid Profile Cholesterol   Date Value Ref Range Status   01/03/2022 188 <200 MG/DL Final     HDL   Date Value Ref Range Status   01/03/2022 55 >40 MG/DL Final     LDL   Date Value Ref Range Status   01/03/2022 121 (H) <100 mg/dL Final     Triglycerides   Date Value Ref Range Status   01/03/2022 89 <150 MG/DL Final      Blood Sugar No results found for: HGBA1C  Glucose   Date Value Ref Range Status   08/13/2023 118 (H) 70 - 105 Final   07/31/2023 95  Final   01/03/2022 102 (H) 70 - 100 MG/DL Final          Problems Addressed Today  Encounter Diagnoses Name Primary?    Screening for heart disease     Coronary artery disease involving native coronary artery of native heart without angina pectoris     Hypercholesteremia     Symptomatic PVCs     Palpitation        Assessment and Plan   Ms. Marez  has no evidence of serious arrhythmias but does have symptomatic palpitations.  Alternatives for treatment were reviewed with the patient.  Previously she had taken 12.5 mg of Toprol -XL at nighttime with symptomatic improvement.  She wanted to start that again.  I have asked her to contact the office in approximately a month to report her progress.  I have asked her to return for follow-up in approximately 6 months time.  Cardiovascular risk factor management was reviewed in detail. The total time spent during this interview and exam with preparation and chart review was 30  minutes.         Current Medications (including today's revisions)   ascorbic acid (vitamin C) 1,000 mg tablet Take one tablet by mouth daily.    CHOLEcalciferoL (vitamin D3) (OPTIMAL D3) 50,000 units capsule Take one capsule by mouth every 7 days.    CREON 36,000-114,000- 180,000 unit capsule Take one capsule by mouth daily.    cyanocobalamin (vitamin B-12) (VITAMIN B12 PO) Take 1 tablet by mouth as Needed.    ferrous sulfate (FEOSOL) 325 mg (65 mg iron) tablet Take one tablet by mouth as Needed. Take on an empty stomach at least 1 hour before or 2 hours after food.    HYDROcodone /acetaminophen  (NORCO) 5/325 mg tablet Take one tablet by mouth daily as needed for Pain.    lidocaine  (LIDODERM ) 5 % topical patch Apply one patch topically to affected area every 24 hours.    LINZESS 145 mcg capsule Take one capsule by mouth daily.    LINZESS 290 mcg capsule TAKE 1 CAPSULE ORALLY AT LEAST . BEFORE THE FIRST MEAL OF THE DAY ON AN EMPTY STOMACH X90 DAYS    metoprolol  succinate XL (TOPROL  XL) 25 mg extended release tablet Take one-half tablet by mouth daily.    progesterone, micronized (PROMETRIUM) 200 mg capsule Take one capsule by mouth daily.

## 2023-08-29 ENCOUNTER — Encounter: Admit: 2023-08-29 | Discharge: 2023-08-29 | Payer: MEDICARE

## 2023-09-06 ENCOUNTER — Encounter: Admit: 2023-09-06 | Discharge: 2023-09-06 | Payer: MEDICARE

## 2023-10-29 ENCOUNTER — Encounter: Admit: 2023-10-29 | Discharge: 2023-10-29 | Payer: MEDICARE

## 2023-10-30 ENCOUNTER — Encounter: Admit: 2023-10-30 | Discharge: 2023-10-30 | Payer: MEDICARE

## 2023-11-05 ENCOUNTER — Ambulatory Visit: Admit: 2023-11-05 | Discharge: 2023-11-05 | Payer: MEDICARE

## 2023-11-05 ENCOUNTER — Encounter: Admit: 2023-11-05 | Discharge: 2023-11-05 | Payer: MEDICARE

## 2023-11-05 ENCOUNTER — Ambulatory Visit: Admit: 2023-11-05 | Discharge: 2023-11-06 | Payer: MEDICARE

## 2023-11-08 ENCOUNTER — Encounter: Admit: 2023-11-08 | Discharge: 2023-11-08 | Payer: MEDICARE

## 2023-11-15 ENCOUNTER — Encounter: Admit: 2023-11-15 | Discharge: 2023-11-15 | Payer: MEDICARE

## 2023-11-19 ENCOUNTER — Encounter: Admit: 2023-11-19 | Discharge: 2023-11-19 | Payer: MEDICARE

## 2023-11-20 ENCOUNTER — Encounter: Admit: 2023-11-20 | Discharge: 2023-11-20 | Payer: MEDICARE

## 2023-11-20 ENCOUNTER — Ambulatory Visit: Admit: 2023-11-20 | Discharge: 2023-11-20 | Payer: MEDICARE

## 2023-11-20 ENCOUNTER — Ambulatory Visit: Admit: 2023-11-20 | Discharge: 2023-11-21 | Payer: MEDICARE

## 2023-11-20 VITALS — BP 132/62 | HR 68 | Ht 64.0 in | Wt 119.8 lb

## 2023-11-20 DIAGNOSIS — Z131 Encounter for screening for diabetes mellitus: Secondary | ICD-10-CM

## 2023-11-20 DIAGNOSIS — R0989 Other specified symptoms and signs involving the circulatory and respiratory systems: Secondary | ICD-10-CM

## 2023-11-20 DIAGNOSIS — E7800 Pure hypercholesterolemia, unspecified: Principal | ICD-10-CM

## 2023-11-20 DIAGNOSIS — I2510 Atherosclerotic heart disease of native coronary artery without angina pectoris: Secondary | ICD-10-CM

## 2023-11-20 MED ORDER — REPATHA SURECLICK 140 MG/ML SC PNIJ
140 mg | SUBCUTANEOUS | 3 refills | 28.00000 days | Status: AC
Start: 2023-11-20 — End: ?

## 2023-11-20 MED ORDER — REPATHA SURECLICK 140 MG/ML SC PNIJ
140 mg | SUBCUTANEOUS | 3 refills | 28.00000 days | Status: DC
Start: 2023-11-20 — End: 2023-11-20

## 2023-11-20 NOTE — Telephone Encounter
 11/20/2023 4:13 PM   PA approved. Patient and pharmacy notified.

## 2023-11-20 NOTE — Telephone Encounter
 Completed PA in cover my meds with the provided key. Waiting for a determination from insurance.

## 2023-11-20 NOTE — Progress Notes
 Cardiovascular Medicine     Date of Service: 11/20/2023    HPI     Robin Braun is a very pleasant 83 year old female who has a past medical history of coronary artery disease, asthma, fibromyalgia, palpitations.    She presents today for a follow-up visit.  She has noticed more chest pain and pressure that radiates through her back.  She feels like a rope is tied around her waist and getting pulled on.  She has accompanying symptoms of lightheadedness and dizziness.  She does not have a formal exercise regimen, but stays very active and busy throughout the day.  The symptoms occur both with rest and with activities.       Vitals:    11/20/23 1107   BP: 132/62   BP Source: Arm, Left Upper   Pulse: 68   SpO2: 99%   O2 Device: None (Room air)   PainSc: Zero   Weight: 54.3 kg (119 lb 12.8 oz)   Height: 162.6 cm (5' 4)     Body mass index is 20.56 kg/m?.     Cardiovascular Studies  Preliminary EKG from today:    Most recent results for 12-Lead ECG   ECG 12-LEAD    Collection Time: 08/28/23 10:01 AM   Result Value Status    VENTRICULAR RATE 62 Final    P-R INTERVAL 148 Final    QRS DURATION 84 Final    Q-T INTERVAL 412 Final    QTC CALCULATION (BAZETT) 418 Final    P AXIS 55 Final    R AXIS -35 Final    T AXIS 26 Final    Impression    Normal sinus rhythm  Left axis deviation  Abnormal ECG  When compared with ECG of 13-Feb-2023 14:37,  No significant change was found  Confirmed by Debroah Standing ([id number]) on 09/03/2023 4:24:00 PM        The ASCVD Risk score (Arnett DK, et al., 2019) failed to calculate for the following reasons:    The 2019 ASCVD risk score is only valid for ages 78 to 44    Problems Addressed Today  Encounter Diagnoses   Name Primary?    Hypercholesteremia Yes    Coronary artery disease involving native coronary artery of native heart without angina pectoris     Screening for diabetes mellitus     Cardiovascular symptoms      1.Coronary artery disease.  She had a cardiac cath in 2022 which diffuse nonobstructive coronary artery disease.  As she has ongoing anginal type symptoms, we will check a regadenoson MPI.    2.  Hyperlipidemia with statin intolerance.  She has previously tried several statins, and had myalgias with these.  As she has coronary artery disease, her goal LDL needs to be less than 70.  She will get lab work checked today, and then started on Repatha at 140 mg every 2 weeks.  Plan to follow-up on her lipid panel after her fourth dose.    3.  Essential hypertension.  Blood pressures are well-controlled on low-dose metoprolol .    4.  Screening for diabetes/prediabetes.  Hemoglobin A1c in June of last year normal at 5.6%.  Recheck with next labs.                 Achille JONELLE Deer, APRN-NP      Total Time Today was 45 minutes in the following activities: Preparing to see the patient, Obtaining and/or reviewing separately obtained history, Performing a medically appropriate examination and/or  evaluation, Counseling and educating the patient/family/caregiver, Ordering medications, tests, or procedures, Documenting clinical information in the electronic or other health record, and Independently interpreting results (not separately reported) and communicating results to the patient/family/caregiver    This note was partially dictated using Dragon Medical One speech recognition software.  Occasional wrong-word or sound-alike substitutions may have occurred due to the inherent limitations of voice-recognition software.  Please read the chart carefully and recognize, using context, where the substitutions may have occurred.  Please do not hesitate to contact me for clarification.       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 Norina  July 1,2020 Laparoscopic Revision of repair Dr. Sharolyn Lowers, Dr Delon Skillern surgeons. [facility]      Functional systolic murmur 09/07/2015     noted on exam 08/07/15  03/26/20 echo [facility] heart clinic after syncope shows normal aortic valve no sclerosis no stenosis.  EF 60 to 65% no valve disease.  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 She has a IgG level of 158, overseen by Dr. Ursula,       Bronchiectasis without complication (CMS-HCC) 02/10/2014     bronchiectasis  Noted by Dr. Jerrell Ursula.       Hepatic steatosis 02/10/2014     2015: CT abdomen: mild fatty infiltration of the liver, mild intrahepatic ductal dilatation.  cholecystectomy. There was mild extrahepatic ductal dilatation, common bile duct was 5.6 mm and on prior CT of 4.4 mm. Suggestion of some mild wall thickening of the distal common duct. There were no hepatic filling defects seen and no adenopathy was seen.Impressions:   fatty infiltration of the liver, post cholecystectomy with ectasia of the bile duct, and mild thickening of the wall of the common duct. In the presence of mildly elevated liver enzymes without elevated bilirubin a biopsy should be considered. No focal solid organ abnormality, a small hiatal hernia, mild left colon diverticulosis, post hysterectomy changes of the bladder.        Palpitation 12/14/2011     2007 palpitations, resolved spontanously  11/13 recurrent palpitations, Eval MAC [name]:K+=4.7, EchoDop: EF 60%, normal LA, No TR, PAP not measurable, Valves OK no effusion.  32 day  event monitor: 5episodes of pounding heartbeats and fluttering palpitations associated w/ RBBB single  PVCs.  06/27/13 8 1/2 minute Bruce Exercise Echo EF 55%, no ischemia. Valves OK.  HR>100% target, rare PVC, stopped w/ fatigue, no ST changes or arrhythmias.            ROS    Physical Exam   Vitals reviewed.  Constitutional: She appears well-developed.   HENT:   Head: Normocephalic and atraumatic.   Nose: Nose normal.   Cardiovascular: Normal rate, regular rhythm, normal heart sounds and normal pulses.   Pulmonary/Chest: Effort normal and breath sounds normal.   Abdominal: Normal appearance and bowel sounds are normal.   Musculoskeletal:      Right lower leg: No edema.      Left lower leg: No edema.   Neurological: She is alert.  Skin: Skin is warm and dry.   Psychiatric: Her behavior is normal. Mood, judgment and thought content normal.       Cardiovascular Health Factors  Vitals BP Readings from Last 3 Encounters:   11/20/23 132/62   11/05/23 131/52   08/28/23 131/67     Wt Readings from Last 3 Encounters:   11/20/23 54.3 kg (119 lb 12.8 oz)   11/05/23 54.9 kg (121 lb)   08/28/23 55.2 kg (121 lb 12.8 oz)     BMI Readings from Last 3 Encounters:   11/20/23 20.56 kg/m?   11/05/23 20.77 kg/m?   08/28/23 20.91 kg/m?      Smoking Tobacco Use History[1]   Lipid Profile Cholesterol   Date Value Ref Range Status   01/03/2022 188 <200 MG/DL Final     HDL   Date Value Ref Range Status   01/03/2022 55 >40 MG/DL Final     LDL   Date Value Ref Range Status   01/03/2022 121 (H) <100 mg/dL Final     Triglycerides   Date Value Ref Range Status   01/03/2022 89 <150 MG/DL Final      Blood Sugar No results found for: HGBA1C  Glucose   Date Value Ref Range Status   08/13/2023 118 (H) 70 - 105 Final   07/31/2023 95  Final   01/03/2022 102 (H) 70 - 100 MG/DL Final                 Current Medications (including today's revisions)   ascorbic acid (vitamin C) 1,000 mg tablet Take one tablet by mouth daily.    cephalexin (KEFLEX) 500 mg capsule Take one capsule by mouth daily.    CHOLEcalciferoL (vitamin D3) (OPTIMAL D3) 50,000 units capsule Take one capsule by mouth every 7 days.    CREON 36,000-114,000- 180,000 unit capsule Take one capsule by mouth daily.    cyanocobalamin (vitamin B-12) (VITAMIN B12 PO) Take 1 tablet by mouth as Needed.    evolocumab (REPATHA SURECLICK) 140 mg/mL injectable PEN Inject 1 mL under the skin every 14 days. Indications: high cholesterol, increased cardiovascular event risk    ferrous sulfate (FEOSOL) 325 mg (65 mg iron) tablet Take one tablet by mouth as Needed. Take on an empty stomach at least 1 hour before or 2 hours after food.    HYDROcodone /acetaminophen  (NORCO) 5/325 mg tablet Take one tablet by mouth daily as needed for Pain.    lidocaine  (LIDODERM ) 5 % topical patch Apply one patch topically to affected area every 24 hours.    LINZESS 145 mcg capsule Take one capsule by mouth daily.    LINZESS 290 mcg capsule TAKE 1 CAPSULE ORALLY AT LEAST . BEFORE THE FIRST MEAL OF THE DAY ON AN EMPTY STOMACH X90 DAYS    metoprolol  succinate XL (TOPROL  XL) 25 mg extended release tablet Take one-half tablet by mouth daily.    progesterone, micronized (PROMETRIUM) 200 mg capsule Take one capsule by mouth daily.               [1]   Social History  Tobacco Use   Smoking Status Never   Smokeless Tobacco Never

## 2023-11-20 NOTE — Patient Instructions
 Follow-Up:  Thank you for allowing us  to participate in your care today! Your After Visit Summary is being completed by Arletta, RN.    The schedule is released approximately 6 months in advance. Please make this appointment on your way out today or you can call scheduling at [phone].    Instructions:  START Repatha.   Labs today.   Schedule stress test on way out today.     [facility]    Nuclear Stress Test Instructions      Your cardiologist has asked that you have a nuclear stress test (also known as a Myocardial Perfusion Imaging (MPI) test.    This evaluation of your heart muscle consists of two sets of nuclear images and either a Treadmill stress test or a chemical stress test, decided by you and your physician.    You will get an IV placed in your arm for the test.    You will need to be able to raise your arm up by your head for about 20 minutes and lie on your back for about 10 minutes.  Please discuss this with your doctor or talk with the nuclear technologist or nurse if these are a problem for you.    It is recommended not to schedule any other appointment for the same day.      Wear comfortable clothing and walking shoes if you are walking on the treadmill.  Women should wear shorts or comfortable pants instead of dresses.   Sweatshirts or T-shirts work really well for imaging.    If you have a Zio Patch cardiac monitor in place, please reschedule your nuclear stress test after your monitoring period is complete. If you would like to proceed with the nuclear stress test during the monitoring time, the patch will have to be removed and the data will be lost; we can place a new patch following testing.       If you have a cardiac monitor with replacement patches, please inform the nurse prior to your appointment.  The monitor will need to be removed during testing. Please bring replacement patches with you so that we can resume monitoring following your test.     There may be enough time to leave to get a snack after the stress portion of the test.   The Technologist will tell you what time to return for the second set of images.    PLEASE NO CAFFEINE 24 HOURS PRIOR TO TEST:  Examples include coffee, tea, decaffeinated drinks, colas, [facility], Dr. Nunzio.  Some orange sodas and root beers have caffeine, please check.  No energy drinks, Excedrin, Midol, or any foods CONTAINING CHOCOLATE.   Consuming Caffeine may postpone your test.    PLEASE DO NOT EAT OR DRINK THE MORNING OF YOUR TEST.  Water is ok to drink with your morning medications.    Please hold these medications the day of test:  Metoprolol       DIABETIC PATIENTS:  if insulin dependent:  please take one third of your insulin with two pieces of dry toast and a small juice.  Bring remaining 2/3   insulin and oral diabetic medications with you to your test.    PLEASE NO TOBACCO PRODUCTS BETWEEN SCANS  You will NOT need a driver for this test.  But are welcome to bring a visitor with you.   Visitors will not be able to accompany you back to stress room.   Please do not bring  children to the nuclear stress test.    TEST FINDINGS:   You will receive the results of the test within 7 business days of completion of this test by telephone. If you have any questions concerning your nuclear stress test or if you do not hear from your cardiologist/or nurse with 7 business days, please call our office.               Repatha (evolocumab)    Class of drugs called a monoclonal antibody    Works by helping the liver reduce levels of bad cholesterol.  Repatha is a PCSK9 inhibitor.  PCSK9 is a protein that affects the liver's ability to take in cholesterol.  By targeting this particular protein, the liver is able to uptake more LDL receptors (bad cholesterol) which allows the liver to more effectively remove LDL cholesterol from the blood.  This leads to overall lower cholesterol levels.  Repatha typically drops cholesterol by about 50% from baseline.    Typical side effects: Injection site reaction, runny nose    140 mg Given every 2 weeks as a subcu injection    Indications: Primary hyperlipidemia, heterozygous familial hypercholesterolemia, CV risk reduction, statin intolerance    Lipids typically reach a therapeutic level after your fourth dose (2 months)    Contacting our office  For NON-URGENT questions please contact us  via message through your MyChart account.   For MEDICATION REFILLS please contact your pharmacy or send a request through MyChart.     For URGENT questions, please call the [facility] team nursing triage line at [phone] (Monday - Friday 8am-5pm).  Leave a detailed message with your name, date of birth, and reason for your call.    If your message is received before 3:30pm, every effort will be made to call you back the same day.    Please allow time for us  to review your chart prior to call back.     Should you have an URGENT concern outside office hours, call the on-call triage line is [phone].    Fax number: [phone]      Results & Testing Follow Up  Please allow 5-7 business days for the results of any testing to be reviewed. Please call our office if you have not heard from a nurse within this time frame.  Should you complete testing at an outside facility, please contact our office so that we can ensure that we have received results for your provider to review.    Lab and test results:  As a part of the CARES act, starting 05/01/2019, some results will be released to you via MyChart immediately and automatically.    You may see results before your provider sees them; however, your provider will review all these results and then they, or one of their team, will notify you of result information and recommendations.     Critical results will be addressed immediately, but otherwise, please allow us  time to get back with you prior to you reaching out to us  for questions.    This will usually take about 72 hours for labs and 5-7 days for procedure test results.

## 2023-11-21 ENCOUNTER — Encounter: Admit: 2023-11-21 | Discharge: 2023-11-21 | Payer: MEDICARE

## 2023-11-26 ENCOUNTER — Encounter: Admit: 2023-11-26 | Discharge: 2023-11-26 | Payer: MEDICARE

## 2023-11-26 NOTE — Telephone Encounter [36]
 Left a voicemail message with d/t/l and instructions for stress test. Callback number provided for questions or concerns.

## 2023-11-28 ENCOUNTER — Encounter: Admit: 2023-11-28 | Discharge: 2023-11-28 | Payer: MEDICARE

## 2023-11-28 ENCOUNTER — Ambulatory Visit: Admit: 2023-11-28 | Discharge: 2023-11-28 | Payer: MEDICARE

## 2023-12-05 ENCOUNTER — Encounter: Admit: 2023-12-05 | Discharge: 2023-12-05 | Payer: MEDICARE

## 2023-12-05 ENCOUNTER — Ambulatory Visit: Admit: 2023-12-05 | Discharge: 2023-12-06 | Payer: MEDICARE

## 2023-12-05 VITALS — BP 110/60 | HR 47 | Ht 64.0 in | Wt 120.0 lb

## 2023-12-05 DIAGNOSIS — I25111 Atherosclerotic heart disease of native coronary artery with angina pectoris with documented spasm: Secondary | ICD-10-CM

## 2023-12-05 DIAGNOSIS — E7800 Pure hypercholesterolemia, unspecified: Secondary | ICD-10-CM

## 2023-12-05 DIAGNOSIS — R002 Palpitations: Secondary | ICD-10-CM

## 2023-12-05 MED ORDER — METOPROLOL SUCCINATE 25 MG PO TB24
12.5 mg | Freq: Every day | ORAL | 0 refills | 90.00000 days | Status: AC
Start: 2023-12-05 — End: ?

## 2023-12-05 MED ORDER — AMLODIPINE 5 MG PO TAB
5 mg | ORAL_TABLET | Freq: Every day | ORAL | 3 refills | 90.00000 days | Status: AC
Start: 2023-12-05 — End: ?

## 2023-12-05 NOTE — Progress Notes [1]
 Date of Service: 12/05/2023    Robin Braun is a 83 y.o. female.       HPI     Robin Braun is a long-established general cardiology GF patient of mine who requested follow-up with me after she had regadenoson MPI study to evaluate squeezing in her chest that was occurring at random but had characteristics of angina other than his behavior.  She has continued to have episodes even after her metoprolol  was increased from 25 to 50 mg daily following the stress test.      Robin Braun saw Dr. Marcey Alexander for the first time in [facility] cardiology clinic who ordered  egadenoson on T99 and Myoview stress test a week ago.  The test showed no ischemia with summed rest  & stress scores both 0.  Her ejection fraction was 86%.  The study was unchanged from prior study and  2011.     Today she describes the same   symptoms of a bandlike constriction around her chest like a rope is wrapped around which sounds like typical angina but it occurs at random at rest or during activities.  These episodes last no longer than 30 seconds.  She has had 2 or 3 in a day maximum.  Sounds like she has had about 8 episodes this week to this morning and in the past week most days she has had at least 1 episode.  She has had a couple awaken her from sleep.\ She was told to double her Toprol -XL last week at the time of the stress test she has been on Toprol -XL 25 twice daily for a week with no change in her chest tightness pattern.  In reviewing her chart I see that I had started her on Toprol  XL 12.5 mg doses that was increased in 2018 when Dr. Alvaro saw her to 12.5 twice daily which was recently doubled to 25 twice daily with no impact on the symptom    I had been seeing her [place] in clinic but I.  Follow-up clinics in [place] in December 2021.  She has come to [place] to see me was previously but I have encouraged her to transfer her care to [facility] where it is more convenient for her particularly when she drives herself to clinics and she is 83 years old.  Updating her status since I have seen her last     She tells me that she lost weight because she was having severe abdominal pain went to [facility] and then was directed back to [place] for surgery which was done 1 year and 1 day ago at [facility]. [facility] by Dr. Krystal Ada.  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  She had abdominal pain for 4 years during evaluations for this difficult to detect syndrome but her pain resolved completely after her partial colectomy.    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 the [place].  This diagnosis was also established when she was in [place] at the [facility].     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 Norina  July 1,2020 Laparoscopic Revision of repair Dr. Sharolyn Lowers, Dr Delon Skillern surgeons. [facility]      Functional systolic murmur 09/07/2015     noted on exam 08/07/15  03/26/20 echo [facility] heart clinic after syncope shows normal aortic valve no sclerosis no stenosis.  EF 60 to 65% no valve disease.  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 She has a IgG level of 158, overseen by Dr. Ursula,       Bronchiectasis without complication (CMS-HCC) 02/10/2014     bronchiectasis  Noted by Dr. Jerrell Ursula.       Hepatic steatosis 02/10/2014     2015: CT abdomen: mild fatty infiltration of the liver, mild intrahepatic ductal dilatation.  cholecystectomy. There was mild extrahepatic ductal dilatation, common bile duct was 5.6 mm and on prior CT of 4.4 mm. Suggestion of some mild wall thickening of the distal common duct. There were no hepatic filling defects seen and no adenopathy was seen.Impressions:   fatty infiltration of the liver, post cholecystectomy with ectasia of the bile duct, and mild thickening of the wall of the common duct. In the presence of mildly elevated liver enzymes without elevated bilirubin a biopsy should be considered. No focal solid organ abnormality, a small hiatal hernia, mild left colon diverticulosis, post hysterectomy changes of the bladder.        Palpitation 12/14/2011     2007 palpitations, resolved spontanously  11/13 recurrent palpitations, Eval [facility] [name]:K+=4.7, EchoDop: EF 60%, normal LA, No TR, PAP not measurable, Valves OK no effusion.  32 day  event monitor: 5episodes of pounding heartbeats and fluttering palpitations associated w/ RBBB single  PVCs.  06/27/13 8 1/2 minute Bruce Exercise Echo EF 55%, no ischemia. Valves OK.  HR>100% target, rare PVC, stopped w/ fatigue, no ST changes or arrhythmias.            Review of Systems   Constitutional: Negative.   HENT: Negative.     Eyes: Negative.    Cardiovascular: Negative.    Respiratory: Negative.     Endocrine: Negative.    Hematologic/Lymphatic: Negative.    Skin: Negative.    Musculoskeletal: Negative.    Gastrointestinal: Negative.    Genitourinary: Negative.    Neurological: Negative.    Psychiatric/Behavioral: Negative.     Allergic/Immunologic: Negative.        Physical Exam  General: Patient in no distress, looks generally healthy. Skin warm and dry, non icteric,  Mucous membranes moist.  Pupils equal and round  Carotids: no bruits    Thyroid not enlarged.  Neck veins: CVP <6 normal, no V wave, no HJR     Respiratory: Breathing comfortably. Lungs clear to percussion & auscultation. No rales, rhonchi or wheezing   Cardiac: Regular rhythm. LV impulse not palpable. Normal S1 & S2, Fourth heart sound, no rub or S3.  Gr i/vi left parasternal  systolic murmur. No diastolic murmur   Abdomen: soft, non-tender, no masses,bruits,hepatic or aortic enlargement. + bowel sounds.   Femoral arteries: Good pulses, no bruits.  Legs/feet: Normal PT pulses, no edema.   Motor: Normal muscle strength. Cognitive: Pleasant demeanor. Good insight. No depression    Cardiovascular Studies  Today's 12 lead EKG: sinus bradycardia, rate 46, left atrial enlargement   The ASCVD Risk score (Arnett DK, et al., 2019) failed to calculate for the following reasons:    The 2019 ASCVD risk score is only valid for ages 80 to 57     Cardiovascular Health Factors  Vitals BP Readings from Last 3 Encounters:   12/05/23 110/60   11/20/23 132/62   11/05/23 131/52     Wt Readings from Last 3 Encounters:   12/05/23 54.4 kg (120 lb)   11/20/23 54.3 kg (119 lb 12.8 oz)   11/05/23 54.9 kg (121 lb)     BMI Readings from Last 3 Encounters:   12/05/23 20.60 kg/m?   11/20/23 20.56 kg/m?   11/05/23 20.77 kg/m?      Smoking Tobacco Use History[1]   Lipid Profile Cholesterol   Date Value Ref Range Status   11/20/2023 205 (H) <200 mg/dL Final     HDL   Date Value Ref Range Status   11/20/2023 69 >40 mg/dL Final     LDL   Date Value Ref Range Status   11/20/2023 134.00 (H) <100.00 mg/dL Final     Triglycerides   Date Value Ref Range Status   11/20/2023 62 <150 mg/dL Final      Blood Sugar Hemoglobin A1C   Date Value Ref Range Status   11/20/2023 5.6 4.0 - 5.7 % Final     Comment:     The ADA recommends that most patients with type 1 and type 2 diabetes maintain an A1c level <7%.     Glucose   Date Value Ref Range Status 11/20/2023 123 (H) 70 - 100 mg/dL Final   92/85/7974 881 (H) 70 - 105 Final   07/31/2023 95  Final          Problems Addressed Today  Encounter Diagnoses   Name Primary?    Screening for heart disease Yes       Assessment and Plan     Robin Braun  did not get symptom improvement with the Toprol  doubled to 25 mg twice daily.  However her heart rate at 47 is the first reading we have ever seen below 50  inthe entire 10-year record I can access on the EMR.  This is too low for her particular when the medication is not providing any benefit.  I reviewed the EMR and see that I started her initially on 12.5 mg years ago when I was still seeing patients in [place] and then met her and her husband the late Ithzel Fedorchak.  There is there are certain circumstances where beta-blockers can put generally can potentiate coronary spasm symptoms.  With her bradycardia and no control of the symptoms on 25 mg daily I am dropping her Toprol  dose down to 12.5 mg the dose she was on in 2016 -  2017 to control palpitations.    To assure that her blood pressure is normal her heart rate is higher and hopefully that we could treat coronary spasm with amlodipine and starting her on a 5 mg dose of amlodipine which should control blood pressure but her heart rate get back up above 60 or to 70  and perhaps alleviate her symptoms.    I would like her to get back in with Dr. Debroah or the nurse practitioner in about a month to see how she is doing.I have not seen her in 2 years because I had been trying to get her to transition to [facility] based cardiologist is I stopped going to [place] in 2021 but she continued to drive down to [facility] to see me episodically even through 2023.  I am really focusing on cardio oncology.  She is at typical access and resident who at age 49 drive yourself down to [facility] when there is someone she wants to see but I really think it is in her best interest to get her care base to our [place] clinic so she can get there safely good weather or bad.    40 minutes were spent  today in the care of this patient and completion of this encounter.  The time was spent reviewing records,  interviewing patient, doing exam, developing diagnosis, creating treatment plan written in patient oriented  terminology  for the AVS, explaining it to the patient and entering further information in the EMR.     NB: The free text in this document was generated through Dragon(TM) software with editing and proofreading  done by the author of this document by me. Some errors may persist. 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  Sometimes metoprolol  does not help spasm symptoms so I have you cut back to the dose I had you 1 in about 2017 which was 12.5 mg just once daily.  You are more sensitive to Toprol  now than you have been.  Your heart rate was under 50 on the Toprol  2025 twice a day dose so I think we need to cut it back just for safety.  If you get older your heart may slow down too much on a beta-blocker and I think that is the situation right now.    Stay on 12.5 because that is what I used successfully about 10 years ago to suppress your palpitations.    For better blood pressure control and to control symptoms from coronary spasm start amlodipine 5 mg daily.  That relaxes the blood vessels going to the heart muscle and should fight coronary spasm.  It will drop your blood pressure but not that much.  Occasionally it causes leg swelling and people on 5 mg but I do not think you that is going to happen with you. Just to be alert.    Would like you to get back in with the [facility] clinic Dr. or nurse in about a month to see how you are doing.  Every doctor has it on his own for her is his own or her own personality.  I have really come to enjoy talking to Dr. Debroah over the years and I  know he will do a terrific job and trying to help you.    It's great to see you again.  I  have not forgotten you gave me that coffee cup that talked about how the good doctor is hard to find.  Having patients is fine is you and Ubaldo are also hard to find.                 Current Medications (including today's revisions)   CHOLEcalciferoL (vitamin D3) (OPTIMAL D3) 50,000 units capsule Take one capsule by mouth every 7 days.    cyanocobalamin (vitamin B-12) (VITAMIN B12 PO) Take 1 tablet by mouth as Needed.    evolocumab (REPATHA SURECLICK) 140 mg/mL injectable PEN Inject 1 mL under the skin every 14 days. Indications: high cholesterol, increased cardiovascular event risk    HYDROcodone /acetaminophen  (NORCO) 5/325 mg tablet Take one tablet by mouth daily as needed for Pain.    lidocaine  (LIDODERM ) 5 % topical patch Apply one patch topically to affected area every 24 hours.    LINZESS 145 mcg capsule Take one capsule by mouth daily.    LINZESS 290 mcg capsule TAKE 1 CAPSULE ORALLY AT LEAST . BEFORE THE FIRST MEAL OF THE DAY ON AN EMPTY STOMACH X90 DAYS    metoprolol  succinate XL (TOPROL  XL) 25 mg extended release tablet Take two tablets by mouth daily.    progesterone, micronized (PROMETRIUM) 200 mg capsule Take one capsule by mouth daily.                 [1]   Social History  Tobacco Use   Smoking Status Never   Smokeless Tobacco Never

## 2023-12-05 NOTE — Patient Instructions [37]
 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  Sometimes metoprolol  does not help spasm symptoms so I have you cut back to the dose I had you 1 in about 2017 which was 12.5 mg just once daily.  You are more sensitive to Toprol  now than you have been.  Your heart rate was under 50 on the Toprol  2025 twice a day dose so I think we need to cut it back just for safety.  If you get older your heart may slow down too much on a beta-blocker and I think that is the situation right now.    Stay on 12.5 because that is what I used successfully about 10 years ago to suppress your palpitations.    For better blood pressure control and to control symptoms from coronary spasm start amlodipine 5 mg daily.  That relaxes the blood vessels going to the heart muscle and should fight coronary spasm.  It will drop your blood pressure but not that much.  Occasionally it causes leg swelling and people on 5 mg but I do not think you that is going to happen with you.  Just to be alert.    Would like you to get back in with the [facility] clinic Dr. or nurse in about a month to see how you are doing.  Every doctor has it on his own for her is his own or her own personality.  I have really come to enjoy talking to Dr. Debroah over the years and I  know he will do a terrific job and trying to help you.    It's great to see you again.  I have not forgotten you gave me that coffee cup that talked about how the good doctor is hard to find.  Having patients is fine is you and Robin Braun are also hard to find.

## 2023-12-06 DIAGNOSIS — Z136 Encounter for screening for cardiovascular disorders: Principal | ICD-10-CM

## 2023-12-07 ENCOUNTER — Encounter: Admit: 2023-12-07 | Discharge: 2023-12-07 | Payer: MEDICARE

## 2024-01-07 ENCOUNTER — Encounter: Admit: 2024-01-07 | Discharge: 2024-01-07 | Payer: MEDICARE

## 2024-01-10 ENCOUNTER — Encounter: Admit: 2024-01-10 | Discharge: 2024-01-10 | Payer: MEDICARE

## 2024-01-14 ENCOUNTER — Encounter: Admit: 2024-01-14 | Discharge: 2024-01-14 | Payer: MEDICARE

## 2024-01-14 NOTE — Telephone Encounter [36]
 Patient reports she received more than 50% relief from her last LESI injections on 02/04/24. She states her relief is still ongoing but in the past usually lasts 3 months and she wishes to have one repeated. During this time she is able to increase her mobility and decrease her PRN medications. She reports her pain a 7/10. She has been performing an ongoing at home exercise program since her last injection.

## 2024-01-28 ENCOUNTER — Encounter: Admit: 2024-01-28 | Discharge: 2024-01-28 | Payer: MEDICARE

## 2024-02-04 ENCOUNTER — Encounter: Admit: 2024-02-04 | Discharge: 2024-02-04 | Payer: MEDICARE

## 2024-02-04 ENCOUNTER — Ambulatory Visit: Admit: 2024-02-04 | Discharge: 2024-02-05 | Payer: MEDICARE

## 2024-02-04 ENCOUNTER — Ambulatory Visit: Admit: 2024-02-04 | Discharge: 2024-02-04 | Payer: MEDICARE

## 2024-02-05 ENCOUNTER — Encounter: Admit: 2024-02-05 | Discharge: 2024-02-05 | Payer: MEDICARE

## 2024-02-05 DIAGNOSIS — E7800 Pure hypercholesterolemia, unspecified: Principal | ICD-10-CM

## 2024-02-05 DIAGNOSIS — Z131 Encounter for screening for diabetes mellitus: Secondary | ICD-10-CM

## 2024-02-05 DIAGNOSIS — R0989 Other specified symptoms and signs involving the circulatory and respiratory systems: Secondary | ICD-10-CM

## 2024-02-05 DIAGNOSIS — I2510 Atherosclerotic heart disease of native coronary artery without angina pectoris: Secondary | ICD-10-CM

## 2024-02-05 LAB — LIPID PROFILE
CHOLESTEROL/HDL %: 2
CHOLESTEROL: 163
HDL: 71
LDL: 76
TRIGLYCERIDES: 82
VLDL: 16

## 2024-02-07 ENCOUNTER — Encounter: Admit: 2024-02-07 | Discharge: 2024-02-07 | Payer: MEDICARE

## 2024-02-07 DIAGNOSIS — E7800 Pure hypercholesterolemia, unspecified: Principal | ICD-10-CM

## 2024-02-07 DIAGNOSIS — I159 Secondary hypertension, unspecified: Secondary | ICD-10-CM

## 2024-02-07 DIAGNOSIS — I25118 Atherosclerotic heart disease of native coronary artery with other forms of angina pectoris: Secondary | ICD-10-CM

## 2024-02-07 MED ORDER — REPATHA SURECLICK 140 MG/ML SC PNIJ
140 mg | SUBCUTANEOUS | 3 refills | 28.00000 days | Status: AC
Start: 2024-02-07 — End: ?

## 2024-02-07 MED ORDER — AMLODIPINE 5 MG PO TAB
5 mg | ORAL_TABLET | Freq: Every day | ORAL | 2 refills | 90.00000 days | Status: AC
Start: 2024-02-07 — End: ?

## 2024-02-07 MED ORDER — METOPROLOL SUCCINATE 25 MG PO TB24
12.5 mg | ORAL_TABLET | Freq: Every day | ORAL | 2 refills | 90.00000 days | Status: AC
Start: 2024-02-07 — End: ?

## 2024-02-27 ENCOUNTER — Encounter: Admit: 2024-02-27 | Discharge: 2024-02-27 | Payer: MEDICARE
